# Patient Record
Sex: Male | Born: 1990 | Race: White | Hispanic: No | Marital: Single | Smoking: Current every day smoker
Health system: Southern US, Community
[De-identification: ages and names within clinical notes are randomized; demographics above are authoritative.]

## PROBLEM LIST (undated history)

## (undated) DIAGNOSIS — N200 Calculus of kidney: Secondary | ICD-10-CM

## (undated) HISTORY — PX: ADENOIDECTOMY: SUR15

## (undated) HISTORY — PX: TONSILLECTOMY: SUR1361

## (undated) HISTORY — PX: OTHER SURGICAL HISTORY: SHX169

## (undated) HISTORY — PX: TYMPANOPLASTY: SHX33

---
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
## 2012-07-01 ENCOUNTER — Encounter (HOSPITAL_COMMUNITY): Payer: Self-pay

## 2012-07-01 ENCOUNTER — Emergency Department (HOSPITAL_COMMUNITY)
Admission: EM | Admit: 2012-07-01 | Discharge: 2012-07-01 | Disposition: A | Discharge status: Home / Self Care | Payer: Self-pay | Attending: Emergency Medicine | Admitting: Emergency Medicine

## 2012-07-01 DIAGNOSIS — T485X1A Poisoning by other anti-common-cold drugs, accidental (unintentional), initial encounter: Secondary | ICD-10-CM | POA: Insufficient documentation

## 2012-07-01 DIAGNOSIS — Y92009 Unspecified place in unspecified non-institutional (private) residence as the place of occurrence of the external cause: Secondary | ICD-10-CM | POA: Insufficient documentation

## 2012-07-01 DIAGNOSIS — Y939 Activity, unspecified: Secondary | ICD-10-CM | POA: Insufficient documentation

## 2012-07-01 DIAGNOSIS — R45851 Suicidal ideations: Secondary | ICD-10-CM | POA: Insufficient documentation

## 2012-07-01 DIAGNOSIS — F3289 Other specified depressive episodes: Secondary | ICD-10-CM | POA: Insufficient documentation

## 2012-07-01 DIAGNOSIS — T50901A Poisoning by unspecified drugs, medicaments and biological substances, accidental (unintentional), initial encounter: Secondary | ICD-10-CM

## 2012-07-01 DIAGNOSIS — F17200 Nicotine dependence, unspecified, uncomplicated: Secondary | ICD-10-CM | POA: Insufficient documentation

## 2012-07-01 DIAGNOSIS — T50992A Poisoning by other drugs, medicaments and biological substances, intentional self-harm, initial encounter: Secondary | ICD-10-CM | POA: Insufficient documentation

## 2012-07-01 DIAGNOSIS — F329 Major depressive disorder, single episode, unspecified: Secondary | ICD-10-CM

## 2012-07-01 LAB — COMPREHENSIVE METABOLIC PANEL
ALT: 16 U/L (ref 0–53)
Albumin: 4.7 g/dL (ref 3.5–5.2)
Alkaline Phosphatase: 83 U/L (ref 39–117)
BUN: 9 mg/dL (ref 6–23)
CO2: 26 mEq/L (ref 19–32)
Chloride: 102 mEq/L (ref 96–112)
GFR calc non Af Amer: >90 mL/min (ref >90)
Total Protein: 7.8 g/dL (ref 6.0–8.3)

## 2012-07-01 LAB — CBC WITH DIFFERENTIAL/PLATELET
Basophils Absolute: 0 10*3/uL (ref 0.0–0.1)
Basophils Relative: 0 % (ref 0–1)
Eosinophils Absolute: 0 10*3/uL (ref 0.0–0.7)
Lymphocytes Relative: 15 % (ref 12–46)
MCV: 87.1 fL (ref 78.0–100.0)
Neutro Abs: 8.2 10*3/uL — ABNORMAL HIGH (ref 1.7–7.7)
RBC: 4.82 MIL/uL (ref 4.22–5.81)
WBC: 10.4 10*3/uL (ref 4.0–10.5)

## 2012-07-01 LAB — ACETAMINOPHEN LEVEL: Acetaminophen (Tylenol), Serum: <15 ug/mL (ref 10–30)

## 2012-07-01 MED ORDER — ALUM & MAG HYDROXIDE-SIMETH 200-200-20 MG/5ML PO SUSP: 30.0000 mL | ORAL | Status: DC | PRN

## 2012-07-01 MED ORDER — ZOLPIDEM TARTRATE 5 MG PO TABS: 10.0000 mg | ORAL_TABLET | Freq: Every evening | ORAL | Status: DC | PRN

## 2012-07-01 MED ORDER — ACETAMINOPHEN 325 MG PO TABS: 650.0000 mg | ORAL_TABLET | ORAL | Status: DC | PRN

## 2012-07-01 MED ORDER — IBUPROFEN 400 MG PO TABS: 600.0000 mg | ORAL_TABLET | Freq: Three times a day (TID) | ORAL | Status: DC | PRN

## 2012-07-01 MED ORDER — NICOTINE 21 MG/24HR TD PT24
21.0000 mg | MEDICATED_PATCH | Freq: Every day | TRANSDERMAL | Status: DC
  Administered 2012-07-01: 21 mg via TRANSDERMAL
  Filled 2012-07-01: qty 1

## 2012-07-01 MED ORDER — SODIUM CHLORIDE 0.9 % IV SOLN: INTRAVENOUS | Status: DC

## 2012-07-01 MED ORDER — ONDANSETRON HCL 4 MG PO TABS: 4.0000 mg | ORAL_TABLET | Freq: Three times a day (TID) | ORAL | Status: DC | PRN

## 2012-07-01 MED ORDER — CITALOPRAM HYDROBROMIDE 20 MG PO TABS: 20.0000 mg | ORAL_TABLET | Freq: Every day | ORAL | Status: DC

## 2012-07-01 NOTE — ED Notes (Signed)
Per ems, pt reports taking 42 OTC decongestant tablets at 0230.  Pt reports that he is going through a hard time and wanted to hurt himself last night but doesn't want to hurt himself today.  Pt reports that he is having trouble at home with his ex-wife and baby.

## 2012-07-01 NOTE — ED Provider Notes (Signed)
History   This chart was scribed for Ward Givens, MD by Gerlean Ren. This patient was seen in room APA06/[facility]06 and the patient's care was started at 14:15.   CSN: [id number]  Arrival date & time 07/01/12  1335   First MD Initiated Contact with Patient 07/01/12 1352      Chief Complaint  Patient presents with  . Ingestion   Level V caveat for psychiatric problem  (Consider location/radiation/quality/duration/timing/severity/associated sxs/prior treatment) The history is provided by the patient and a parent. No language interpreter was used.   Jason Khan is a 21 y.o. male brought in by ambulance to the Emergency Department after taking # 32 tablets of chlorpheniramine  4mg /dextromethorphan 30mg   to attempt suicide.  He states he bought the packs of pills a couple days ago and was planning to get high. Pt moved back home from [place] 1 week ago after having separated from wife  4-5 months ago, who still has his 2 y.o. daughter and has been depressed since then.  Per mother, pt was behaving abnormally this morning at 08:00 when he got up "talking about God a lot" and relates he had a religious experience and being unusually emotional.  She reports he was groggy and falling and "stop breathing" a few times. Pt did not tell mother of what he had done until 10:00.  Pt denies being in any current pain and does not answer when asked if he is currently suicidal.  Patient initially stated he took the pills to "go to sleep" but he then admits he was trying to hurt himself.  Pt has no h/o chronic medical conditions.  Pt reports tobacco use but denies alcohol use.    PCP none  History reviewed. No pertinent past medical history.  History reviewed. No pertinent past surgical history.  No family history on file.  History  Substance Use Topics  . Smoking status: Yes 1/2 ppd  . Smokeless tobacco: Not on file  . Alcohol Use: No  +[employer] Unemployed Living with mother Separated from  wife   Review of Systems  Neurological: Negative for dizziness.  Psychiatric/Behavioral: Positive for suicidal ideas.  All other systems reviewed and are negative.    Allergies  Review of patient's allergies indicates no known allergies.  Home Medications  No current outpatient prescriptions on file.  BP 147/100  Pulse 89  Temp 98.3 F (36.8 C) (Oral)  Resp 14  Ht 6' (1.829 m)  Wt 130 lb (58.968 kg)  BMI 17.63 kg/m2  SpO2 96%  Vital signs normal except hypertension   Physical Exam  Nursing note and vitals reviewed. Constitutional: He is oriented to person, place, and time. He appears well-developed and well-nourished.  Non-toxic appearance. He does not appear ill. No distress.  HENT:  Head: Normocephalic and atraumatic.  Right Ear: External ear normal.  Left Ear: External ear normal.  Nose: Nose normal. No mucosal edema or rhinorrhea.  Mouth/Throat: Oropharynx is clear and moist and mucous membranes are normal. No dental abscesses or uvula swelling.       Tongue is dry.  Eyes: Conjunctivae normal and EOM are normal. Pupils are equal, round, and reactive to light.  Neck: Normal range of motion and full passive range of motion without pain. Neck supple.  Cardiovascular: Normal rate, regular rhythm and normal heart sounds.  Exam reveals no gallop and no friction rub.   No murmur heard. Pulmonary/Chest: Effort normal and breath sounds normal. No respiratory distress. He has no wheezes. He  has no rhonchi. He has no rales. He exhibits no tenderness and no crepitus.  Abdominal: Soft. Normal appearance and bowel sounds are normal. He exhibits no distension. There is no tenderness. There is no rebound and no guarding.  Musculoskeletal: Normal range of motion. He exhibits no edema and no tenderness.       Moves all extremities well.   Neurological: He is alert and oriented to person, place, and time. He has normal strength. No cranial nerve deficit.  Skin: Skin is warm, dry and  intact. No rash noted. No erythema. No pallor.  Psychiatric: His speech is normal. His mood appears not anxious.       Flat affect.  Spoke quietly. Responds slowly    ED Course  Procedures (including critical care time)     DIAGNOSTIC STUDIES: Oxygen Saturation is 96% on room air, adequate by my interpretation.    COORDINATION OF CARE: 14:28- Patient and family informed of clinical course, understand medical decision-making process, and agree with plan.  Ordered IV fluids, CBC, c-met, ethanol, drug screen, acetaminophen, and salicylate.  15:23 Samson Frederic, ACT aware patient needs evaluation  15:48 Poison Control states anti-cholinergic overdose, once his tachycardia and hypertension has resolved which could be 6-12 hours after ingestion he would be considered medically clear. She also states he was at risk for seizure and if he has a seizure to treat with benzos and mainly supportive care at this point.   16:20 BP 128/83, HR 83  telepsych consult ordered   Results for orders placed during the hospital encounter of 07/01/12  CBC WITH DIFFERENTIAL      Component Value Range   WBC 10.4  4.0 - 10.5 K/uL   RBC 4.82  4.22 - 5.81 MIL/uL   Hemoglobin 14.9  13.0 - 17.0 g/dL   HCT 73.2  20.2 - 54.2 %   MCV 87.1  78.0 - 100.0 fL   MCH 30.9  26.0 - 34.0 pg   MCHC 35.5  30.0 - 36.0 g/dL   RDW 70.6  23.7 - 62.8 %   Platelets 281  150 - 400 K/uL   Neutrophils Relative 79 (*) 43 - 77 %   Neutro Abs 8.2 (*) 1.7 - 7.7 K/uL   Lymphocytes Relative 15  12 - 46 %   Lymphs Abs 1.5  0.7 - 4.0 K/uL   Monocytes Relative 6  3 - 12 %   Monocytes Absolute 0.6  0.1 - 1.0 K/uL   Eosinophils Relative 0  0 - 5 %   Eosinophils Absolute 0.0  0.0 - 0.7 K/uL   Basophils Relative 0  0 - 1 %   Basophils Absolute 0.0  0.0 - 0.1 K/uL  COMPREHENSIVE METABOLIC PANEL      Component Value Range   Sodium 139  135 - 145 mEq/L   Potassium 3.8  3.5 - 5.1 mEq/L   Chloride 102  96 - 112 mEq/L   CO2 26  19 - 32 mEq/L    Glucose, Bld 92  70 - 99 mg/dL   BUN 9  6 - 23 mg/dL   Creatinine, Ser 3.15  0.50 - 1.35 mg/dL   Calcium 9.6  8.4 - 17.6 mg/dL   Total Protein 7.8  6.0 - 8.3 g/dL   Albumin 4.7  3.5 - 5.2 g/dL   AST 16  0 - 37 U/L   ALT 16  0 - 53 U/L   Alkaline Phosphatase 83  39 - 117 U/L   Total Bilirubin  0.4  0.3 - 1.2 mg/dL   GFR calc non Af Amer >90  >90 mL/min   GFR calc Af Amer >90  >90 mL/min  ETHANOL      Component Value Range   Alcohol, Ethyl (B) <11  0 - 11 mg/dL  ACETAMINOPHEN LEVEL      Component Value Range   Acetaminophen (Tylenol), Serum <15.0  10 - 30 ug/mL  SALICYLATE LEVEL      Component Value Range   Salicylate Lvl <2.0 (*) 2.8 - 20.0 mg/dL   Laboratory interpretation all normal except       1. Depression   2. Overdose     Plan psychiatric placement.  MDM   I personally performed the services described in this documentation, which was scribed in my presence. The recorded information has been reviewed and considered.  Devoria Albe, MD, Armando Gang         Ward Givens, MD 07/01/12 [phone]

## 2012-07-01 NOTE — BH Assessment (Signed)
Assessment Note   Jason Khan is an 21 y.o. male.PT REPORTS HE TOOK 32 OVER THE COUNTER COLD PILLS LAST NIGHT JUST TO HAVE FUN WITH THEM THEN HE REPORTS HE DID NOT CARE IF HE WOKE UP OR NOT.  PT THEN REPORTS HE'S NOT SUICIDAL.  HE TOLD HIS MOTHER WHAT HE HAD DONE . HE WAS THEN BROUGHT TO THE ED.  PT REPORTS HE AND HIS WIFE SEPARATED 7-8 MONTHS AGO AND SHE HAS THEIR 2 YR OLD DAUGHTER.  HE REPORTS HE SAW THEIR DAUGHTER DAILY UNTIL MOVING BACK TO  [facility]  RECENTLY.  PT DENIES DRUG USE OR EVER DOING ANYTHING LIKE THIS BEFORE.  DR Philhaven REQUESTED TELE-PSYCH.        Axis I: Mood Disorder NOS Axis II: Deferred Axis III: History reviewed. No pertinent past medical history. Axis IV: problems with primary support group Axis V: 51-60 moderate symptoms         Past Medical History: History reviewed. No pertinent past medical history.  History reviewed. No pertinent past surgical history.  Family History: No family history on file.  Social History:  does not have a smoking history on file. He does not have any smokeless tobacco history on file. His alcohol and drug histories not on file.  Additional Social History:  Alcohol / Drug Use Pain Medications: na Prescriptions: na Over the Counter: na History of alcohol / drug use?: No history of alcohol / drug abuse  CIWA: CIWA-Ar BP: 138/91 mmHg Pulse Rate: 77  COWS:    Allergies: No Known Allergies  Home Medications:  (Not in a hospital admission)  OB/GYN Status:  No LMP for male patient.  General Assessment Data Location of Assessment: [facility] ACT Assessment: Yes Living Arrangements: Parent Can pt return to current living arrangement?: Yes Admission Status: Voluntary Is patient capable of signing voluntary admission?: Yes Transfer from: Acute Hospital Referral Source: MD     Risk to self Suicidal Ideation: No Is patient at risk for suicide?: No Suicidal Plan?: No Access to Means: No What has been your  use of drugs/alcohol within the last 12 months?: denies Previous Attempts/Gestures: No How many times?: 0  Other Self Harm Risks: na Triggers for Past Attempts: None known Intentional Self Injurious Behavior: None Family Suicide History: No Recent stressful life event(s): Loss (Comment) (seperation from wife 7-8 months ago) Persecutory voices/beliefs?: No Depression: Yes Depression Symptoms: Despondent;Feeling worthless/self pity Substance abuse history and/or treatment for substance abuse?: No Suicide prevention information given to non-admitted patients: Not applicable  Risk to Others Homicidal Ideation: No Thoughts of Harm to Others: No Current Homicidal Intent: No Current Homicidal Plan: No Access to Homicidal Means: No History of harm to others?: No Assessment of Violence: None Noted Violent Behavior Description: na Does patient have access to weapons?: No Criminal Charges Pending?: No Does patient have a court date: No  Psychosis Hallucinations: None noted Delusions: None noted  Mental Status Report Appear/Hygiene: Improved Eye Contact: Good Motor Activity: Freedom of movement Speech: Logical/coherent Level of Consciousness: Alert Mood: Depressed Affect: Depressed;Appropriate to circumstance Anxiety Level: Minimal Thought Processes: Coherent;Relevant Judgement: Impaired Orientation: Person;Place;Time;Situation Obsessive Compulsive Thoughts/Behaviors: Minimal  Cognitive Functioning Concentration: Normal Memory: Recent Intact;Remote Intact IQ: Average Insight: Fair Impulse Control: Fair Appetite: Good Sleep: No Change Total Hours of Sleep: 8  Vegetative Symptoms: None  ADLScreening [facility] Assessment Services) Patient's cognitive ability adequate to safely complete daily activities?: Yes Patient able to express need for assistance with ADLs?: Yes Independently performs ADLs?: Yes (  appropriate for developmental age)  Abuse/Neglect [facility]) Physical Abuse:  Denies Verbal Abuse: Denies Sexual Abuse: Denies  Prior Inpatient Therapy Prior Inpatient Therapy: No  Prior Outpatient Therapy Prior Outpatient Therapy: No  ADL Screening (condition at time of admission) Patient's cognitive ability adequate to safely complete daily activities?: Yes Patient able to express need for assistance with ADLs?: Yes Independently performs ADLs?: Yes (appropriate for developmental age) Weakness of Legs: None Weakness of Arms/Hands: None  Home Assistive Devices/Equipment Home Assistive Devices/Equipment: None  Therapy Consults (therapy consults require a physician order) PT Evaluation Needed: No OT Evalulation Needed: No SLP Evaluation Needed: No Abuse/Neglect Assessment (Assessment to be complete while patient is alone) Physical Abuse: Denies Verbal Abuse: Denies Sexual Abuse: Denies Exploitation of patient/patient's resources: Denies Self-Neglect: Denies Values / Beliefs Cultural Requests During Hospitalization: None Spiritual Requests During Hospitalization: None Consults Spiritual Care Consult Needed: No Social Work Consult Needed: No Merchant navy officer (For Healthcare) Advance Directive: Patient does not have advance directive;Patient would not like information Pre-existing out of facility DNR order (yellow form or pink MOST form): No    Additional Information 1:1 In Past 12 Months?: No CIRT Risk: No Elopement Risk: No Does patient have medical clearance?: Yes     Disposition: pending tele-psych WITH COMMITMENT PAPERS UNTIL DISPOSITION Disposition Disposition of Patient:  (pending tele-psych)  On Site Evaluation by: DR Samuel Jester  Reviewed with Physician: DR Samuel Jester    Jason Khan 07/01/2012 9:34 PM

## 2012-07-01 NOTE — ED Provider Notes (Signed)
Pt seen by telepsych and is no longer suicidal telepsych feels patient can be discharged on celexa Pt denies SI at this time.  Seems reasonable to d/c home and close outpatient followup He has also been seen by ACT Labs/vitals reviewed BP 138/91  Pulse 77  Temp 98.3 F (36.8 C) (Oral)  Resp 20  Ht 6' (1.829 m)  Wt 130 lb (58.968 kg)  BMI 17.63 kg/m2  SpO2 97%    Joya Gaskins, MD 07/01/12 1848

## 2012-07-01 NOTE — ED Notes (Signed)
Patient stating he wants to go home and doesn't really want to go to treatment.  Counseled him re: his discussion w/ACT representative.  Reminded him that if he was not voluntary, he would have involuntary committal papers taken out.  He requests IV out and monitor off, of which he was obliged.  Requested he put on paper scrubs.

## 2012-07-01 NOTE — ED Notes (Signed)
Patient with no complaints at this time. Respirations even and unlabored. Skin warm/dry. Discharge instructions reviewed with patient at this time. Patient given opportunity to voice concerns/ask questions. Patient discharged at this time and left Emergency Department with steady gait.   

## 2012-07-01 NOTE — ED Notes (Signed)
Spoke w/Tonya at [facility] to update.

## 2012-12-19 ENCOUNTER — Emergency Department (HOSPITAL_COMMUNITY): Payer: Self-pay

## 2012-12-19 ENCOUNTER — Encounter (HOSPITAL_COMMUNITY): Payer: Self-pay | Admitting: Emergency Medicine

## 2012-12-19 ENCOUNTER — Emergency Department (HOSPITAL_COMMUNITY)
Admission: EM | Admit: 2012-12-19 | Discharge: 2012-12-20 | Disposition: A | Discharge status: Home / Self Care | Payer: Self-pay | Attending: Emergency Medicine | Admitting: Emergency Medicine

## 2012-12-19 DIAGNOSIS — F17200 Nicotine dependence, unspecified, uncomplicated: Secondary | ICD-10-CM | POA: Insufficient documentation

## 2012-12-19 DIAGNOSIS — Z79899 Other long term (current) drug therapy: Secondary | ICD-10-CM | POA: Insufficient documentation

## 2012-12-19 DIAGNOSIS — R109 Unspecified abdominal pain: Secondary | ICD-10-CM | POA: Insufficient documentation

## 2012-12-19 DIAGNOSIS — Z87442 Personal history of urinary calculi: Secondary | ICD-10-CM | POA: Insufficient documentation

## 2012-12-19 HISTORY — DX: Calculus of kidney: N20.0

## 2012-12-19 LAB — URINALYSIS, ROUTINE W REFLEX MICROSCOPIC
Bilirubin Urine: NEGATIVE
Glucose, UA: NEGATIVE mg/dL
Hgb urine dipstick: NEGATIVE
Protein, ur: NEGATIVE mg/dL
pH: 7 (ref 5.0–8.0)

## 2012-12-19 MED ORDER — ONDANSETRON HCL 4 MG/2ML IJ SOLN
4.0000 mg | Freq: Once | INTRAMUSCULAR | Status: AC
  Administered 2012-12-19: 4 mg via INTRAVENOUS
  Filled 2012-12-19: qty 2

## 2012-12-19 MED ORDER — HYDROMORPHONE HCL PF 1 MG/ML IJ SOLN
1.0000 mg | Freq: Once | INTRAMUSCULAR | Status: AC
  Administered 2012-12-19: 1 mg via INTRAVENOUS
  Filled 2012-12-19: qty 1

## 2012-12-19 NOTE — ED Notes (Signed)
Patient complaining of left flank pain radiating into left groin area x 4 days. Patient has history of kidney stones.

## 2012-12-20 ENCOUNTER — Telehealth (HOSPITAL_COMMUNITY): Payer: Self-pay | Admitting: Emergency Medicine

## 2012-12-20 MED ORDER — HYDROMORPHONE HCL PF 1 MG/ML IJ SOLN
1.0000 mg | Freq: Once | INTRAMUSCULAR | Status: AC
  Administered 2012-12-20: 1 mg via INTRAVENOUS
  Filled 2012-12-20: qty 1

## 2012-12-20 MED ORDER — KETOROLAC TROMETHAMINE 30 MG/ML IJ SOLN
30.0000 mg | Freq: Once | INTRAMUSCULAR | Status: AC
  Administered 2012-12-20: 30 mg via INTRAVENOUS
  Filled 2012-12-20: qty 1

## 2012-12-20 MED ORDER — CYCLOBENZAPRINE HCL 10 MG PO TABS: 10.0000 mg | ORAL_TABLET | Freq: Three times a day (TID) | ORAL | Status: DC | PRN

## 2012-12-20 MED ORDER — MELOXICAM 7.5 MG PO TABS: 7.5000 mg | ORAL_TABLET | Freq: Every day | ORAL | Status: DC

## 2012-12-20 MED ORDER — HYDROCODONE-ACETAMINOPHEN 7.5-325 MG PO TABS: 1.0000 | ORAL_TABLET | ORAL | Status: DC | PRN

## 2012-12-20 NOTE — ED Provider Notes (Signed)
History     CSN: [id number]  Arrival date & time 12/19/12  2035   First MD Initiated Contact with Patient 12/19/12 2300      Chief Complaint  Patient presents with  . Flank Pain    (Consider location/radiation/quality/duration/timing/severity/associated sxs/prior treatment) Patient is a 22 y.o. male presenting with flank pain. The history is provided by the patient and the spouse.  Flank Pain This is a recurrent problem. The current episode started in the past 7 days. The problem occurs daily. The problem has been gradually worsening. Pertinent negatives include no abdominal pain, arthralgias, chest pain, coughing or neck pain. Associated symptoms comments: Left flank pain. Nothing aggravates the symptoms. He has tried lying down (muscle relaxers) for the symptoms. The treatment provided no relief.    Past Medical History  Diagnosis Date  . Kidney stones     Past Surgical History  Procedure Laterality Date  . Tonsillectomy    . Adenoidectomy    . Tubes in ears      History reviewed. No pertinent family history.  History  Substance Use Topics  . Smoking status: Current Every Day Smoker  . Smokeless tobacco: Not on file  . Alcohol Use: No      Review of Systems  Constitutional: Negative for activity change.       All ROS Neg except as noted in HPI  HENT: Negative for nosebleeds and neck pain.   Eyes: Negative for photophobia and discharge.  Respiratory: Negative for cough, shortness of breath and wheezing.   Cardiovascular: Negative for chest pain and palpitations.  Gastrointestinal: Negative for abdominal pain and blood in stool.  Genitourinary: Positive for flank pain. Negative for dysuria, frequency and hematuria.  Musculoskeletal: Negative for back pain and arthralgias.  Skin: Negative.   Neurological: Negative for dizziness, seizures and speech difficulty.  Psychiatric/Behavioral: Negative for hallucinations and confusion.    Allergies  Review of  patient's allergies indicates no known allergies.  Home Medications   Current Outpatient Rx  Name  Route  Sig  Dispense  Refill  . cyclobenzaprine (FLEXERIL) 10 MG tablet   Oral   Take 10 mg by mouth once as needed for muscle spasms.         Marland Kitchen ibuprofen (ADVIL,MOTRIN) 200 MG tablet   Oral   Take 600-800 mg by mouth daily as needed for pain.         . Prenatal Vit-Fe Fumarate-FA (PRENATAL MULTIVITAMIN) TABS   Oral   Take 1 tablet by mouth daily at 12 noon.           BP 149/89  Pulse 76  Temp(Src) 98.1 F (36.7 C) (Oral)  Resp 24  Ht 6' (1.829 m)  Wt 165 lb (74.844 kg)  BMI 22.37 kg/m2  SpO2 100%  Physical Exam  Nursing note and vitals reviewed. Constitutional: He is oriented to person, place, and time. He appears well-developed and well-nourished.  Non-toxic appearance.  HENT:  Head: Normocephalic.  Right Ear: Tympanic membrane and external ear normal.  Left Ear: Tympanic membrane and external ear normal.  Eyes: EOM and lids are normal. Pupils are equal, round, and reactive to light.  Neck: Normal range of motion. Neck supple. Carotid bruit is not present.  Cardiovascular: Normal rate, regular rhythm, normal heart sounds, intact distal pulses and normal pulses.   Pulmonary/Chest: Breath sounds normal. No respiratory distress.  Course breath sounds and mod rhonchi.  Abdominal: Soft. Bowel sounds are normal. There is no tenderness. There is no guarding.  Left CVAT.   Genitourinary: Penis normal.  No testicular swelling. No evidence for hernia.  Musculoskeletal: Normal range of motion.  Left flank pain with attempted ROM of the lower back.  Lymphadenopathy:       Head (right side): No submandibular adenopathy present.       Head (left side): No submandibular adenopathy present.    He has no cervical adenopathy.  Neurological: He is alert and oriented to person, place, and time. He has normal strength. No cranial nerve deficit or sensory deficit.  Skin: Skin is  warm and dry.  Psychiatric: He has a normal mood and affect. His speech is normal.    ED Course  Procedures (including critical care time)  Labs Reviewed  URINALYSIS, ROUTINE W REFLEX MICROSCOPIC   Ct Abdomen Pelvis Wo Contrast  12/20/2012  *RADIOLOGY REPORT*  Clinical Data: Flank and testicle pain.  CT ABDOMEN AND PELVIS WITHOUT CONTRAST  Technique:  Multidetector CT imaging of the abdomen and pelvis was performed following the standard protocol without intravenous contrast.  Comparison: 07/26/2006  Findings: Limited images through the lung bases demonstrate no significant appreciable abnormality. The heart size is within normal limits. No pleural or pericardial effusion.  Organ abnormality/lesion detection is limited in the absence of intravenous contrast. Within this limitation, unremarkable liver, biliary system, spleen, pancreas, adrenal glands.  Nonobstructing right renal stone, upper pole.  Minimal hydronephrosis to the level of a 4 mm stone at or just below the UPJ.  No hydronephrosis or hydroureter on the left.  No CT evidence for colitis.  Appendix normal.  Small bowel loops are normal course and caliber.  No free intraperitoneal air or fluid.  No lymphadenopathy.  Normal caliber aorta and branch vessels.  Thin-walled bladder.  Nonspecific central prostatic calcifications.  No acute osseous finding.  IMPRESSION: Mild right hydronephrosis to the level of a 4 mm stone at the UPJ.  Nonobstructing upper pole right renal stone.   Original Report Authenticated By: Jearld Lesch, M.D.    Dg Chest 2 View  12/20/2012  *RADIOLOGY REPORT*  Clinical Data: Pain with inspiration.  Smoker.  CHEST - 2 VIEW  Comparison: None.  Findings: Midline trachea.  Normal heart size and mediastinal contours. No pleural effusion or pneumothorax.  Clear lungs.  IMPRESSION: No acute cardiopulmonary disease.   Original Report Authenticated By: Jeronimo Greaves, M.D.      No diagnosis found.    MDM  I have reviewed  nursing notes, vital signs, and all appropriate lab and imaging results for this patient.  Pt ;presents to ED with flank pain on the left flank. UA wnl. CT reveals a 4mm stone at the UPJ on the right.  Chest xray is negative. Plan -  Urology evaluation as out patient. Rx for flexeril (patieint request) , norco, and mobic given to the patient. Pt to strain all urine. He will return if any changes or problem.       Kathie Dike, PA-C 12/29/12 [phone]

## 2012-12-20 NOTE — ED Notes (Signed)
Pt just wanting to get Rx for Vicodin filled.  Is that ok.?  Spoke w/ pharmacy ok to fill just vicodin Rx.

## 2012-12-30 NOTE — ED Provider Notes (Signed)
Medical screening examination/treatment/procedure(s) were performed by non-physician practitioner and as supervising physician I was immediately available for consultation/collaboration.  John-Adam Ladonne Sharples, M.D.  John-Adam Zaveon Gillen, MD 12/30/12 0825 

## 2013-01-05 ENCOUNTER — Emergency Department (HOSPITAL_COMMUNITY): Payer: Self-pay

## 2013-01-05 ENCOUNTER — Encounter (HOSPITAL_COMMUNITY): Payer: Self-pay | Admitting: *Deleted

## 2013-01-05 ENCOUNTER — Emergency Department (HOSPITAL_COMMUNITY)
Admission: EM | Admit: 2013-01-05 | Discharge: 2013-01-05 | Disposition: A | Discharge status: Home / Self Care | Payer: Self-pay | Attending: Emergency Medicine | Admitting: Emergency Medicine

## 2013-01-05 DIAGNOSIS — N23 Unspecified renal colic: Secondary | ICD-10-CM | POA: Insufficient documentation

## 2013-01-05 DIAGNOSIS — Z79899 Other long term (current) drug therapy: Secondary | ICD-10-CM | POA: Insufficient documentation

## 2013-01-05 DIAGNOSIS — Z87442 Personal history of urinary calculi: Secondary | ICD-10-CM | POA: Insufficient documentation

## 2013-01-05 DIAGNOSIS — R110 Nausea: Secondary | ICD-10-CM | POA: Insufficient documentation

## 2013-01-05 DIAGNOSIS — F17200 Nicotine dependence, unspecified, uncomplicated: Secondary | ICD-10-CM | POA: Insufficient documentation

## 2013-01-05 LAB — COMPREHENSIVE METABOLIC PANEL
ALT: 11 U/L (ref 0–53)
AST: 14 U/L (ref 0–37)
Albumin: 3.9 g/dL (ref 3.5–5.2)
Alkaline Phosphatase: 105 U/L (ref 39–117)
Chloride: 101 mEq/L (ref 96–112)
Potassium: 3.7 mEq/L (ref 3.5–5.1)
Total Bilirubin: 0.1 mg/dL — ABNORMAL LOW (ref 0.3–1.2)

## 2013-01-05 LAB — URINALYSIS, ROUTINE W REFLEX MICROSCOPIC
Ketones, ur: NEGATIVE mg/dL
Leukocytes, UA: NEGATIVE
Nitrite: NEGATIVE
Protein, ur: NEGATIVE mg/dL
pH: 8 (ref 5.0–8.0)

## 2013-01-05 LAB — CBC WITH DIFFERENTIAL/PLATELET
Basophils Absolute: 0.1 10*3/uL (ref 0.0–0.1)
Basophils Relative: 1 % (ref 0–1)
Hemoglobin: 13.9 g/dL (ref 13.0–17.0)
MCHC: 35.1 g/dL (ref 30.0–36.0)
Neutro Abs: 2.9 10*3/uL (ref 1.7–7.7)
Neutrophils Relative %: 44 % (ref 43–77)
RDW: 13.4 % (ref 11.5–15.5)
WBC: 6.5 10*3/uL (ref 4.0–10.5)

## 2013-01-05 MED ORDER — SODIUM CHLORIDE 0.9 % IV SOLN
1000.0000 mL | Freq: Once | INTRAVENOUS | Status: AC
  Administered 2013-01-05: 1000 mL via INTRAVENOUS

## 2013-01-05 MED ORDER — HYDROMORPHONE HCL PF 1 MG/ML IJ SOLN
1.0000 mg | Freq: Once | INTRAMUSCULAR | Status: AC
  Administered 2013-01-05: 1 mg via INTRAVENOUS
  Filled 2013-01-05: qty 1

## 2013-01-05 MED ORDER — OXYCODONE-ACETAMINOPHEN 5-325 MG PO TABS: 1.0000 | ORAL_TABLET | ORAL | Status: DC | PRN

## 2013-01-05 MED ORDER — ONDANSETRON HCL 4 MG/2ML IJ SOLN
4.0000 mg | Freq: Once | INTRAMUSCULAR | Status: AC
  Administered 2013-01-05: 4 mg via INTRAVENOUS
  Filled 2013-01-05: qty 2

## 2013-01-05 MED ORDER — SODIUM CHLORIDE 0.9 % IV SOLN
1000.0000 mL | INTRAVENOUS | Status: DC
  Administered 2013-01-05: 1000 mL via INTRAVENOUS

## 2013-01-05 MED ORDER — KETOROLAC TROMETHAMINE 30 MG/ML IJ SOLN
30.0000 mg | Freq: Once | INTRAMUSCULAR | Status: AC
  Administered 2013-01-05: 30 mg via INTRAVENOUS
  Filled 2013-01-05: qty 1

## 2013-01-05 NOTE — ED Provider Notes (Signed)
History     CSN: [id number]  Arrival date & time 01/05/13  1520   First MD Initiated Contact with Patient 01/05/13 1615      Chief Complaint  Patient presents with  . Flank Pain    (Consider location/radiation/quality/duration/timing/severity/associated sxs/prior treatment) Patient is a 22 y.o. male presenting with flank pain. The history is provided by the patient.  Flank Pain  He had been in the emergency department several weeks ago with left-sided kidney stone. 4 days ago, he started having pain in the right flank which is similar to the pain he had with the kidney stone on the other side. There is associated nausea without vomiting. He noticed a decreased urine output but no dysuria. Nothing makes his pain better nothing makes it worse. Pain is moderate to severe and he rates it at 8/10. He has been taking BC powder for pain with only slight relief. He denies fever, chills, sweats.  Past Medical History  Diagnosis Date  . Kidney stones     Past Surgical History  Procedure Laterality Date  . Tonsillectomy    . Adenoidectomy    . Tubes in ears      No family history on file.  History  Substance Use Topics  . Smoking status: Current Every Day Smoker    Types: Cigarettes  . Smokeless tobacco: Not on file  . Alcohol Use: No      Review of Systems  Genitourinary: Positive for flank pain.  All other systems reviewed and are negative.    Allergies  Review of patient's allergies indicates no known allergies.  Home Medications   Current Outpatient Rx  Name  Route  Sig  Dispense  Refill  . Chlorpheniramine-DM (CORICIDIN HBP COUGH/COLD PO)   Oral   Take 2 tablets by mouth daily as needed (for cough).         . cyclobenzaprine (FLEXERIL) 10 MG tablet   Oral   Take 1 tablet (10 mg total) by mouth 3 (three) times daily as needed.   21 tablet   0   . HYDROcodone-acetaminophen (NORCO) 7.5-325 MG per tablet   Oral   Take 1 tablet by mouth every 4 (four) hours  as needed for pain.   20 tablet   0   . ibuprofen (ADVIL,MOTRIN) 200 MG tablet   Oral   Take 600-800 mg by mouth daily as needed for pain.         . Prenatal Vit-Fe Fumarate-FA (PRENATAL MULTIVITAMIN) TABS   Oral   Take 1 tablet by mouth daily at 12 noon.         Marland Kitchen oxyCODONE-acetaminophen (PERCOCET/ROXICET) 5-325 MG per tablet   Oral   Take 1 tablet by mouth every 4 (four) hours as needed for pain.   20 tablet   0     BP 135/79  Pulse 68  Temp(Src) 98.3 F (36.8 C) (Oral)  Resp 18  Ht 6' (1.829 m)  Wt 165 lb (74.844 kg)  BMI 22.37 kg/m2  SpO2 98%  Physical Exam  Nursing note and vitals reviewed.  22 year old male, resting comfortably and in no acute distress. Vital signs are normal. Oxygen saturation is 98%, which is normal. Head is normocephalic and atraumatic. PERRLA, EOMI. Oropharynx is clear. Neck is nontender and supple without adenopathy or JVD. Back is nontender in the midline, and there is moderate right CVA tenderness. Lungs are clear without rales, wheezes, or rhonchi. Chest is nontender. Heart has regular rate and rhythm without  murmur. Abdomen is soft, flat, nontender without masses or hepatosplenomegaly and peristalsis is normoactive. Extremities have no cyanosis or edema, full range of motion is present. Skin is warm and dry without rash. Neurologic: Mental status is normal, cranial nerves are intact, there are no motor or sensory deficits.  ED Course  Procedures (including critical care time)  Results for orders placed during the hospital encounter of 01/05/13  URINALYSIS, ROUTINE W REFLEX MICROSCOPIC      Result Value Range   Color, Urine YELLOW  YELLOW   APPearance CLEAR  CLEAR   Specific Gravity, Urine 1.015  1.005 - 1.030   pH 8.0  5.0 - 8.0   Glucose, UA NEGATIVE  NEGATIVE mg/dL   Hgb urine dipstick NEGATIVE  NEGATIVE   Bilirubin Urine NEGATIVE  NEGATIVE   Ketones, ur NEGATIVE  NEGATIVE mg/dL   Protein, ur NEGATIVE  NEGATIVE mg/dL    Urobilinogen, UA 0.2  0.0 - 1.0 mg/dL   Nitrite NEGATIVE  NEGATIVE   Leukocytes, UA NEGATIVE  NEGATIVE  CBC WITH DIFFERENTIAL      Result Value Range   WBC 6.5  4.0 - 10.5 K/uL   RBC 4.43  4.22 - 5.81 MIL/uL   Hemoglobin 13.9  13.0 - 17.0 g/dL   HCT 13.0  86.5 - 78.4 %   MCV 89.4  78.0 - 100.0 fL   MCH 31.4  26.0 - 34.0 pg   MCHC 35.1  30.0 - 36.0 g/dL   RDW 69.6  29.5 - 28.4 %   Platelets 298  150 - 400 K/uL   Neutrophils Relative 44  43 - 77 %   Neutro Abs 2.9  1.7 - 7.7 K/uL   Lymphocytes Relative 39  12 - 46 %   Lymphs Abs 2.6  0.7 - 4.0 K/uL   Monocytes Relative 10  3 - 12 %   Monocytes Absolute 0.6  0.1 - 1.0 K/uL   Eosinophils Relative 5  0 - 5 %   Eosinophils Absolute 0.4  0.0 - 0.7 K/uL   Basophils Relative 1  0 - 1 %   Basophils Absolute 0.1  0.0 - 0.1 K/uL  COMPREHENSIVE METABOLIC PANEL      Result Value Range   Sodium 140  135 - 145 mEq/L   Potassium 3.7  3.5 - 5.1 mEq/L   Chloride 101  96 - 112 mEq/L   CO2 29  19 - 32 mEq/L   Glucose, Bld 87  70 - 99 mg/dL   BUN 6  6 - 23 mg/dL   Creatinine, Ser 1.32  0.50 - 1.35 mg/dL   Calcium 9.3  8.4 - 44.0 mg/dL   Total Protein 6.6  6.0 - 8.3 g/dL   Albumin 3.9  3.5 - 5.2 g/dL   AST 14  0 - 37 U/L   ALT 11  0 - 53 U/L   Alkaline Phosphatase 105  39 - 117 U/L   Total Bilirubin 0.1 (*) 0.3 - 1.2 mg/dL   GFR calc non Af Amer >90  >90 mL/min   GFR calc Af Amer >90  >90 mL/min  LIPASE, BLOOD      Result Value Range   Lipase 51  11 - 59 U/L   US Renal  01/05/2013  *RADIOLOGY REPORT*  Clinical Data: Flank pain.  RENAL/URINARY TRACT ULTRASOUND COMPLETE  Comparison:  CT abdomen and pelvis 12/19/2012.  Findings:  Right Kidney:  No hydronephrosis.  Well-preserved cortex.  Normal size and parenchymal echotexture without focal  abnormalities. The maximal length is 10.9 cm.  A nonobstructing stone at the lower pole of the right kidney is not visualized.  Left Kidney:  No hydronephrosis.  Well-preserved cortex.  Normal size and  parenchymal echotexture without focal abnormalities. The maximal length is 9.1 cm.  Bladder:  Ureteral jets are noted bilaterally.  A 4.4 mm echogenic focus is noted at the right UVJ and may represent a partially obstructing stone.  The bladder is otherwise unremarkable.  IMPRESSION:  1.  Possible distal right ureteral stone.  An echogenic focus is noted near the right UVJ which may be partially obstructing. 2.  No evidence for hydronephrosis or significant nephrolithiasis.   Original Report Authenticated By: Marin Roberts, M.D.       1. Ureteral colic       MDM  Probable right ureteral colic. Old records are reviewed and he did have a left ureteral stone recently and CT showed a proximal right ureteral calculus as well as a calculus in the right kidney. I reviewed the CT scan and the calculi are not easily visible on topogram, so KUB would not be useful to assess the position. You'll be sent for ultrasound. You'll be given IV fluids, IV ketorolac, IV hydromorphone, and IV ondansetron.  He got modest relief of pain but pain recurred. He was given a second dose of hydromorphone with better relief of pain. Ultrasound does show probable distal right ureteral calculus without hydronephrosis. He is sent home with a prescription for Percocet and is referred to on-call urologist.        Dione Booze, MD 01/05/13 [phone]

## 2013-01-05 NOTE — ED Notes (Signed)
Prepack given as override

## 2013-01-05 NOTE — ED Notes (Signed)
Right flank pain x 4 days with nausea.

## 2013-01-09 MED FILL — Oxycodone w/ Acetaminophen Tab 5-325 MG: ORAL | Qty: 6 | Status: AC

## 2013-01-13 ENCOUNTER — Encounter (HOSPITAL_COMMUNITY): Payer: Self-pay | Admitting: Emergency Medicine

## 2013-01-13 DIAGNOSIS — R109 Unspecified abdominal pain: Secondary | ICD-10-CM | POA: Insufficient documentation

## 2013-01-13 DIAGNOSIS — F17200 Nicotine dependence, unspecified, uncomplicated: Secondary | ICD-10-CM | POA: Insufficient documentation

## 2013-01-13 DIAGNOSIS — Z87442 Personal history of urinary calculi: Secondary | ICD-10-CM | POA: Insufficient documentation

## 2013-01-13 DIAGNOSIS — N200 Calculus of kidney: Secondary | ICD-10-CM | POA: Insufficient documentation

## 2013-01-13 LAB — COMPREHENSIVE METABOLIC PANEL
AST: 17 U/L (ref 0–37)
CO2: 25 mEq/L (ref 19–32)
Chloride: 106 mEq/L (ref 96–112)
Creatinine, Ser: 1.05 mg/dL (ref 0.50–1.35)
GFR calc non Af Amer: >90 mL/min (ref >90)
Total Bilirubin: 0.4 mg/dL (ref 0.3–1.2)

## 2013-01-13 LAB — URINALYSIS, ROUTINE W REFLEX MICROSCOPIC
Glucose, UA: NEGATIVE mg/dL
Ketones, ur: 15 mg/dL — AB
pH: 6.5 (ref 5.0–8.0)

## 2013-01-13 LAB — URINE MICROSCOPIC-ADD ON

## 2013-01-13 LAB — CBC WITH DIFFERENTIAL/PLATELET
Basophils Absolute: 0 10*3/uL (ref 0.0–0.1)
Basophils Relative: 1 % (ref 0–1)
Eosinophils Absolute: 0.2 10*3/uL (ref 0.0–0.7)
Lymphs Abs: 2.5 10*3/uL (ref 0.7–4.0)
MCH: 30.8 pg (ref 26.0–34.0)
MCHC: 34.6 g/dL (ref 30.0–36.0)
Neutrophils Relative %: 53 % (ref 43–77)
Platelets: 283 10*3/uL (ref 150–400)
RBC: 4.55 MIL/uL (ref 4.22–5.81)

## 2013-01-13 NOTE — ED Notes (Signed)
PT. REPORTS RECURRING RIGHT FLANK PAIN RADIATING TO RIGHT TESTICLE WITH HEMATURIA ONSET LAST NIGHT UNRELIEVED BY PRESCRIPTION PAIN MEDICATIONS FOR KIDNEY STONES , HOSPITALIZED AT [facility] LAST WEEK FOR KIDNEY STONES.

## 2013-01-14 ENCOUNTER — Emergency Department (HOSPITAL_COMMUNITY)
Admission: EM | Admit: 2013-01-14 | Discharge: 2013-01-14 | Disposition: A | Discharge status: Home / Self Care | Payer: Self-pay | Attending: Emergency Medicine | Admitting: Emergency Medicine

## 2013-01-14 ENCOUNTER — Telehealth (HOSPITAL_COMMUNITY): Payer: Self-pay | Admitting: Emergency Medicine

## 2013-01-14 DIAGNOSIS — R109 Unspecified abdominal pain: Secondary | ICD-10-CM

## 2013-01-14 DIAGNOSIS — N200 Calculus of kidney: Secondary | ICD-10-CM

## 2013-01-14 MED ORDER — ONDANSETRON 4 MG PO TBDP: 4.0000 mg | ORAL_TABLET | Freq: Three times a day (TID) | ORAL | Status: DC | PRN

## 2013-01-14 MED ORDER — SODIUM CHLORIDE 0.9 % IV SOLN
Freq: Once | INTRAVENOUS | Status: AC
  Administered 2013-01-14: 03:00:00 via INTRAVENOUS

## 2013-01-14 MED ORDER — BELLADONNA ALKALOIDS-OPIUM 16.2-60 MG RE SUPP: 1.0000 | Freq: Two times a day (BID) | RECTAL | Status: DC | PRN

## 2013-01-14 MED ORDER — HYDROMORPHONE HCL PF 1 MG/ML IJ SOLN
1.0000 mg | Freq: Once | INTRAMUSCULAR | Status: AC
  Administered 2013-01-14: 1 mg via INTRAVENOUS
  Filled 2013-01-14: qty 1

## 2013-01-14 MED ORDER — KETOROLAC TROMETHAMINE 30 MG/ML IJ SOLN
30.0000 mg | Freq: Once | INTRAMUSCULAR | Status: AC
  Administered 2013-01-14: 30 mg via INTRAVENOUS
  Filled 2013-01-14: qty 1

## 2013-01-14 MED ORDER — BELLADONNA ALKALOIDS-OPIUM 16.2-60 MG RE SUPP
1.0000 | Freq: Three times a day (TID) | RECTAL | Status: DC | PRN
  Administered 2013-01-14: 1 via RECTAL
  Filled 2013-01-14: qty 1

## 2013-01-14 MED ORDER — TAMSULOSIN HCL 0.4 MG PO CAPS: 0.4000 mg | ORAL_CAPSULE | Freq: Every day | ORAL | Status: DC

## 2013-01-14 MED ORDER — OXYCODONE-ACETAMINOPHEN 5-325 MG PO TABS: 2.0000 | ORAL_TABLET | ORAL | Status: DC | PRN

## 2013-01-14 NOTE — ED Notes (Signed)
Family members given refreshments

## 2013-01-14 NOTE — ED Provider Notes (Signed)
History     CSN: [id number]  Arrival date & time 01/13/13  2027   First MD Initiated Contact with Patient 01/14/13 0154      Chief Complaint  Patient presents with  . Flank Pain  . Nephrolithiasis    (Consider location/radiation/quality/duration/timing/severity/associated sxs/prior treatment) HPI 22 year old male presents to emergency room with complaint of right flank pain.  Patient, reports he's had ongoing intermittent, flank pain over the last few weeks.  He was seen at [facility] on April 21 and diagnosed with a kidneys stone by CT scan.  He was seen again on May 5, and diagnosed with another kidney stone.  Patient reports he was a.  No vomiting.  Kathryne Sharper on this past Sunday.  He reports he has run out of the pain and nausea medicines that were given to him.  He has not yet followed up with urology.  He reports a prior history of kidney stones.  He reports he was told he had 4 stones, but has only passed one.  Per records, patient had stone at his UVJ based on ultrasound on his visit at 5/8. 4 mm stones seen on CTscan from 422 at right UPJ.  Records received from [place], showing 3 mm stone in right midurete his past Sunday.  He last had vomiting on Monday. Had fever to 102 5 days ago, none since He denies any allergies. Past Medical History  Diagnosis Date  . Kidney stones     Past Surgical History  Procedure Laterality Date  . Tonsillectomy    . Adenoidectomy    . Tubes in ears      No family history on file.  History  Substance Use Topics  . Smoking status: Current Every Day Smoker    Types: Cigarettes  . Smokeless tobacco: Not on file  . Alcohol Use: No      Review of Systems  All other systems reviewed and are negative.    Allergies  Review of patient's allergies indicates no known allergies.  Home Medications   Current Outpatient Rx  Name  Route  Sig  Dispense  Refill  . ketorolac (TORADOL) 10 MG tablet   Oral   Take 10 mg by mouth every 6  (six) hours as needed for pain.         . promethazine (PHENERGAN) 25 MG tablet   Oral   Take 25 mg by mouth every 6 (six) hours as needed for nausea.         . ondansetron (ZOFRAN ODT) 4 MG disintegrating tablet   Oral   Take 1 tablet (4 mg total) by mouth every 8 (eight) hours as needed for nausea.   20 tablet   0   . opium-belladonna (B&O SUPPRETTES) 16.2-60 MG suppository   Rectal   Place 1 suppository rectally 2 (two) times daily as needed for pain.   20 suppository   0   . oxyCODONE-acetaminophen (PERCOCET/ROXICET) 5-325 MG per tablet   Oral   Take 2 tablets by mouth every 4 (four) hours as needed for pain.   20 tablet   0   . tamsulosin (FLOMAX) 0.4 MG CAPS   Oral   Take 1 capsule (0.4 mg total) by mouth daily.   30 capsule   0     BP 138/74  Pulse 67  Temp(Src) 98.3 F (36.8 C) (Oral)  Resp 16  SpO2 100%  Physical Exam  Nursing note and vitals reviewed. Constitutional: He is oriented to person, place, and  time. He appears well-developed and well-nourished.  patient is lying on the stretcher, in no acute distress, no signs of ureter colic on exam  HENT:  Head: Normocephalic and atraumatic.  Nose: Nose normal.  Mouth/Throat: Oropharynx is clear and moist.  Eyes: Conjunctivae and EOM are normal. Pupils are equal, round, and reactive to light.  Neck: Normal range of motion. Neck supple. No JVD present. No tracheal deviation present. No thyromegaly present.  Cardiovascular: Normal rate, regular rhythm, normal heart sounds and intact distal pulses.  Exam reveals no gallop and no friction rub.   No murmur heard. Pulmonary/Chest: Effort normal and breath sounds normal. No stridor. No respiratory distress. He has no wheezes. He has no rales. He exhibits no tenderness.  Abdominal: Soft. Bowel sounds are normal. He exhibits no distension and no mass. There is no tenderness. There is no rebound and no guarding.  Musculoskeletal: Normal range of motion. He exhibits  no edema and no tenderness.  Lymphadenopathy:    He has no cervical adenopathy.  Neurological: He is alert and oriented to person, place, and time. He exhibits normal muscle tone. Coordination normal.  Skin: Skin is warm and dry. No rash noted. No erythema. No pallor.  Psychiatric: He has a normal mood and affect. His behavior is normal. Judgment and thought content normal.    ED Course  Procedures (including critical care time)  Labs Reviewed  URINALYSIS, ROUTINE W REFLEX MICROSCOPIC - Abnormal; Notable for the following:    Color, Urine AMBER (*)    APPearance CLOUDY (*)    Hgb urine dipstick LARGE (*)    Bilirubin Urine SMALL (*)    Ketones, ur 15 (*)    Protein, ur 30 (*)    Urobilinogen, UA 2.0 (*)    Leukocytes, UA MODERATE (*)    All other components within normal limits  COMPREHENSIVE METABOLIC PANEL - Abnormal; Notable for the following:    Glucose, Bld 69 (*)    All other components within normal limits  URINE CULTURE  CBC WITH DIFFERENTIAL  URINE MICROSCOPIC-ADD ON   No results found.   1. Right flank pain   2. Nephrolithiasis       MDM  22 year old male with nephrolithiasis.  Patient counseled on the importance for followup with urology for definitive care.  Advised he should go to [facility] should he have complications involved with a stone, based on urology preference.  He has received pain and nausea.  Medications here.  Patient sleeping comfortably in the bed with his girlfriend.  He will be discharged home with prescriptions until he is able to be seen by urology.       Olivia Mackie, MD 01/14/13 [phone]

## 2013-01-14 NOTE — ED Notes (Signed)
Dr Otter at bedside  

## 2013-01-15 LAB — URINE CULTURE

## 2013-02-01 ENCOUNTER — Emergency Department (HOSPITAL_COMMUNITY): Payer: Self-pay

## 2013-02-01 ENCOUNTER — Emergency Department (HOSPITAL_COMMUNITY)
Admission: EM | Admit: 2013-02-01 | Discharge: 2013-02-01 | Disposition: A | Discharge status: Home / Self Care | Payer: Self-pay | Attending: Emergency Medicine | Admitting: Emergency Medicine

## 2013-02-01 ENCOUNTER — Encounter (HOSPITAL_COMMUNITY): Payer: Self-pay | Admitting: Emergency Medicine

## 2013-02-01 DIAGNOSIS — R112 Nausea with vomiting, unspecified: Secondary | ICD-10-CM | POA: Insufficient documentation

## 2013-02-01 DIAGNOSIS — F17200 Nicotine dependence, unspecified, uncomplicated: Secondary | ICD-10-CM | POA: Insufficient documentation

## 2013-02-01 DIAGNOSIS — N23 Unspecified renal colic: Secondary | ICD-10-CM | POA: Insufficient documentation

## 2013-02-01 DIAGNOSIS — Z87442 Personal history of urinary calculi: Secondary | ICD-10-CM | POA: Insufficient documentation

## 2013-02-01 LAB — GLUCOSE, CAPILLARY: Glucose-Capillary: 69 mg/dL — ABNORMAL LOW (ref 70–99)

## 2013-02-01 LAB — BASIC METABOLIC PANEL
Chloride: 105 mEq/L (ref 96–112)
GFR calc Af Amer: >90 mL/min (ref >90)
Potassium: 4.2 mEq/L (ref 3.5–5.1)
Sodium: 141 mEq/L (ref 135–145)

## 2013-02-01 LAB — CBC WITH DIFFERENTIAL/PLATELET
Basophils Absolute: 0.1 10*3/uL (ref 0.0–0.1)
Basophils Relative: 1 % (ref 0–1)
Hemoglobin: 14 g/dL (ref 13.0–17.0)
MCHC: 35 g/dL (ref 30.0–36.0)
Monocytes Relative: 9 % (ref 3–12)
Neutro Abs: 4.2 10*3/uL (ref 1.7–7.7)
Neutrophils Relative %: 46 % (ref 43–77)
RDW: 12.8 % (ref 11.5–15.5)

## 2013-02-01 LAB — URINALYSIS, ROUTINE W REFLEX MICROSCOPIC
Specific Gravity, Urine: 1.02 (ref 1.005–1.030)
Urobilinogen, UA: 0.2 mg/dL (ref 0.0–1.0)
pH: 6 (ref 5.0–8.0)

## 2013-02-01 MED ORDER — TAMSULOSIN HCL 0.4 MG PO CAPS
ORAL_CAPSULE | ORAL | Status: AC
  Filled 2013-02-01: qty 1

## 2013-02-01 MED ORDER — HYDROCODONE-ACETAMINOPHEN 5-325 MG PO TABS: 1.0000 | ORAL_TABLET | Freq: Four times a day (QID) | ORAL | Status: DC | PRN

## 2013-02-01 MED ORDER — SODIUM CHLORIDE 0.9 % IV BOLUS (SEPSIS)
500.0000 mL | Freq: Once | INTRAVENOUS | Status: AC
  Administered 2013-02-01: 500 mL via INTRAVENOUS

## 2013-02-01 MED ORDER — SODIUM CHLORIDE 0.9 % IV SOLN
Freq: Once | INTRAVENOUS | Status: AC
  Administered 2013-02-01: 03:00:00 via INTRAVENOUS

## 2013-02-01 MED ORDER — PROMETHAZINE HCL 25 MG/ML IJ SOLN
25.0000 mg | Freq: Once | INTRAMUSCULAR | Status: AC
  Administered 2013-02-01: 25 mg via INTRAVENOUS
  Filled 2013-02-01: qty 1

## 2013-02-01 MED ORDER — DEXTROSE 50 % IV SOLN
INTRAVENOUS | Status: AC
  Filled 2013-02-01: qty 50

## 2013-02-01 MED ORDER — SODIUM CHLORIDE 0.9 % IV BOLUS (SEPSIS)
1000.0000 mL | Freq: Once | INTRAVENOUS | Status: AC
  Administered 2013-02-01: 1000 mL via INTRAVENOUS

## 2013-02-01 MED ORDER — PHENAZOPYRIDINE HCL 200 MG PO TABS: 200.0000 mg | ORAL_TABLET | Freq: Three times a day (TID) | ORAL | Status: DC

## 2013-02-01 MED ORDER — DEXTROSE 50 % IV SOLN: 1.0000 | Freq: Once | INTRAVENOUS | Status: AC

## 2013-02-01 MED ORDER — TAMSULOSIN HCL 0.4 MG PO CAPS
0.4000 mg | ORAL_CAPSULE | Freq: Once | ORAL | Status: AC
  Administered 2013-02-01: 0.4 mg via ORAL
  Filled 2013-02-01: qty 1

## 2013-02-01 MED ORDER — HYDROMORPHONE HCL PF 1 MG/ML IJ SOLN
1.0000 mg | Freq: Once | INTRAMUSCULAR | Status: AC
  Administered 2013-02-01: 1 mg via INTRAVENOUS
  Filled 2013-02-01: qty 1

## 2013-02-01 NOTE — ED Notes (Signed)
Patient c/o left flank pain x 2 days; states had kidney stone 2 weeks ago.

## 2013-02-02 NOTE — ED Provider Notes (Signed)
History     CSN: [id number]  Arrival date & time 02/01/13  0015   First MD Initiated Contact with Patient 02/01/13 0138      Chief Complaint  Patient presents with  . Flank Pain    HPI patient arrives withright flank pain. Patient says he's had previous stones on the right a few weeks ago and over the last 2 days he's had recurrence of a similar type of pain. The pain is in the low right flank, 10 out of 10, associated with nausea and vomiting, as a sharp stabbing pain, it is constant but occasionally gets worse, he says he's not able to afford medications to help treat it. He has not made a follow up with urology. He denies any fevers, chills, chest pain, shortness of breath.  Past Medical History  Diagnosis Date  . Kidney stones     Past Surgical History  Procedure Laterality Date  . Tonsillectomy    . Adenoidectomy    . Tubes in ears      No family history on file.  History  Substance Use Topics  . Smoking status: Current Every Day Smoker    Types: Cigarettes  . Smokeless tobacco: Not on file  . Alcohol Use: No      Review of Systems At least 10pt or greater review of systems completed and are negative except where specified in the HPI.  Allergies  Review of patient's allergies indicates no known allergies.  Home Medications   Current Outpatient Rx  Name  Route  Sig  Dispense  Refill  . HYDROcodone-acetaminophen (NORCO/VICODIN) 5-325 MG per tablet   Oral   Take 1-2 tablets by mouth every 6 (six) hours as needed for pain.   13 tablet   0   . ketorolac (TORADOL) 10 MG tablet   Oral   Take 10 mg by mouth every 6 (six) hours as needed for pain.         Marland Kitchen ondansetron (ZOFRAN ODT) 4 MG disintegrating tablet   Oral   Take 1 tablet (4 mg total) by mouth every 8 (eight) hours as needed for nausea.   20 tablet   0   . opium-belladonna (B&O SUPPRETTES) 16.2-60 MG suppository   Rectal   Place 1 suppository rectally 2 (two) times daily as needed for pain.    20 suppository   0   . oxyCODONE-acetaminophen (PERCOCET/ROXICET) 5-325 MG per tablet   Oral   Take 2 tablets by mouth every 4 (four) hours as needed for pain.   20 tablet   0   . phenazopyridine (PYRIDIUM) 200 MG tablet   Oral   Take 1 tablet (200 mg total) by mouth 3 (three) times daily.   6 tablet   0   . promethazine (PHENERGAN) 25 MG tablet   Oral   Take 25 mg by mouth every 6 (six) hours as needed for nausea.         . tamsulosin (FLOMAX) 0.4 MG CAPS   Oral   Take 1 capsule (0.4 mg total) by mouth daily.   30 capsule   0     BP 131/73  Pulse 76  Temp(Src) 97.7 F (36.5 C) (Oral)  Resp 20  Ht 6' (1.829 m)  Wt 170 lb (77.111 kg)  BMI 23.05 kg/m2  SpO2 100%  Physical Exam  Nursing notes reviewed.  Electronic medical record reviewed. VITAL SIGNS:   Filed Vitals:   02/01/13 0029 02/01/13 0248  BP: 126/88 131/73  Pulse: 76   Temp: 97.7 F (36.5 C)   TempSrc: Oral   Resp: 20   Height: 6' (1.829 m)   Weight: 170 lb (77.111 kg)   SpO2: 100%    CONSTITUTIONAL: Awake, oriented, appears non-toxic HENT: Atraumatic, normocephalic, oral mucosa pink and moist, airway patent. Nares patent without drainage. External ears normal. EYES: Conjunctiva clear, EOMI, PERRLA NECK: Trachea midline, non-tender, supple CARDIOVASCULAR: Normal heart rate, Normal rhythm, No murmurs, rubs, gallops PULMONARY/CHEST: Clear to auscultation, no rhonchi, wheezes, or rales. Symmetrical breath sounds. Non-tender. ABDOMINAL: Non-distended, soft, non-tender - no rebound or guarding.  BS normal. Right flank tenderness to percussion NEUROLOGIC: Non-focal, moving all four extremities, no gross sensory or motor deficits. EXTREMITIES: No clubbing, cyanosis, or edema SKIN: Warm, Dry, No erythema, No rash  ED Course  Procedures (including critical care time)  Labs Reviewed  BASIC METABOLIC PANEL - Abnormal; Notable for the following:    Glucose, Bld 62 (*)    All other components within  normal limits  URINALYSIS, ROUTINE W REFLEX MICROSCOPIC - Abnormal; Notable for the following:    Glucose, UA 250 (*)    All other components within normal limits  GLUCOSE, CAPILLARY - Abnormal; Notable for the following:    Glucose-Capillary 69 (*)    All other components within normal limits  CBC WITH DIFFERENTIAL   Dg Abd 1 View  02/01/2013   *RADIOLOGY REPORT*  Clinical Data: Left-sided flank pain.  History of kidney stones.  ABDOMEN - 1 VIEW  Comparison: No priors.  Findings: Gas and stool are seen scattered throughout the colon extending to the level of the distal rectum.  No pathologic distension of small bowel is noted.  No gross evidence of pneumoperitoneum.  No definite calcifications are noted projecting over either kidney, along the course of either ureter, or over the urinary bladder.  IMPRESSION: 1.  No definite abnormal urinary tract calculi noted on this plain film examination. 2.  Nonobstructive bowel gas pattern.   Original Report Authenticated By: Trudie Reed, M.D.     1. Renal colic on right side       MDM  Patient arrives with possible right-sided stone, KUB does not show this stone, he did have a small stone seen on prior CT.  The patient's urine is unremarkable - no red blood cells seen-is possible the patient is having some ureteral spasm after our ready passing the stone. I do not think an additional CAT scan would change medical disposition or change treatment plan.  We'll treat the patient symptomatically, no indication of infection either. Patient to followup with urology.  I explained the diagnosis and have given explicit precautions to return to the ER including any other new or worsening symptoms. The patient understands and accepts the medical plan as it's been dictated and I have answered their questions. Discharge instructions concerning home care and prescriptions have been given.  The patient is STABLE and is discharged to home in good  condition.       Jones Skene, MD 02/02/13 [phone]

## 2013-08-15 ENCOUNTER — Encounter (HOSPITAL_COMMUNITY): Payer: Self-pay | Admitting: Emergency Medicine

## 2013-08-15 ENCOUNTER — Emergency Department (HOSPITAL_COMMUNITY): Payer: Self-pay

## 2013-08-15 ENCOUNTER — Emergency Department (HOSPITAL_COMMUNITY)
Admission: EM | Admit: 2013-08-15 | Discharge: 2013-08-15 | Disposition: A | Discharge status: Home / Self Care | Payer: Self-pay | Attending: Emergency Medicine | Admitting: Emergency Medicine

## 2013-08-15 DIAGNOSIS — R110 Nausea: Secondary | ICD-10-CM | POA: Insufficient documentation

## 2013-08-15 DIAGNOSIS — R300 Dysuria: Secondary | ICD-10-CM | POA: Insufficient documentation

## 2013-08-15 DIAGNOSIS — R109 Unspecified abdominal pain: Secondary | ICD-10-CM | POA: Insufficient documentation

## 2013-08-15 DIAGNOSIS — F17200 Nicotine dependence, unspecified, uncomplicated: Secondary | ICD-10-CM | POA: Insufficient documentation

## 2013-08-15 DIAGNOSIS — R61 Generalized hyperhidrosis: Secondary | ICD-10-CM | POA: Insufficient documentation

## 2013-08-15 DIAGNOSIS — Z87442 Personal history of urinary calculi: Secondary | ICD-10-CM | POA: Insufficient documentation

## 2013-08-15 LAB — URINALYSIS, ROUTINE W REFLEX MICROSCOPIC
Bilirubin Urine: NEGATIVE
Glucose, UA: NEGATIVE mg/dL
Nitrite: NEGATIVE
Specific Gravity, Urine: 1.025 (ref 1.005–1.030)
pH: 6.5 (ref 5.0–8.0)

## 2013-08-15 LAB — URINE MICROSCOPIC-ADD ON

## 2013-08-15 MED ORDER — HYDROMORPHONE HCL PF 1 MG/ML IJ SOLN
1.0000 mg | Freq: Once | INTRAMUSCULAR | Status: AC
  Administered 2013-08-15: 1 mg via INTRAVENOUS
  Filled 2013-08-15: qty 1

## 2013-08-15 MED ORDER — SODIUM CHLORIDE 0.9 % IV SOLN
Freq: Once | INTRAVENOUS | Status: AC
  Administered 2013-08-15: 21:00:00 via INTRAVENOUS

## 2013-08-15 MED ORDER — KETOROLAC TROMETHAMINE 30 MG/ML IJ SOLN
30.0000 mg | Freq: Once | INTRAMUSCULAR | Status: AC
  Administered 2013-08-15: 30 mg via INTRAVENOUS
  Filled 2013-08-15: qty 1

## 2013-08-15 MED ORDER — OXYCODONE-ACETAMINOPHEN 5-325 MG PO TABS
1.0000 | ORAL_TABLET | Freq: Once | ORAL | Status: AC
  Administered 2013-08-15: 1 via ORAL
  Filled 2013-08-15: qty 1

## 2013-08-15 MED ORDER — OXYCODONE-ACETAMINOPHEN 5-325 MG PO TABS: 1.0000 | ORAL_TABLET | Freq: Four times a day (QID) | ORAL | Status: DC | PRN

## 2013-08-15 NOTE — ED Provider Notes (Addendum)
CSN: [id number]     Arrival date & time 08/15/13  1725 History  This chart was scribed for Gerhard Munch, MD by Luisa Dago, ED Scribe. This patient was seen in room APA18/[facility] and the patient's care was started at 8:34 PM.    Chief Complaint  Patient presents with  . Flank Pain    HPI HPI Comments: Jason Khan is a 22 y.o. male who presents to the Emergency Department complaining of tight waxing and waining left flank pain that started 2 days ago. Pt is complaining of associated nausea, diaphoresis, and dysuria. Pt does have a history of kidney stones. He states that one was removed recently at [facility]. Pt denies chest pain, difficulty breathing and SOB. Pt denies, chest pain, difficulty breathing.     Past Medical History  Diagnosis Date  . Kidney stones    Past Surgical History  Procedure Laterality Date  . Tonsillectomy    . Adenoidectomy    . Tubes in ears     No family history on file. History  Substance Use Topics  . Smoking status: Current Every Day Smoker    Types: Cigarettes  . Smokeless tobacco: Not on file  . Alcohol Use: No    Review of Systems  Constitutional:       Per HPI, otherwise negative  HENT:       Per HPI, otherwise negative  Respiratory:       Per HPI, otherwise negative  Cardiovascular:       Per HPI, otherwise negative  Gastrointestinal: Negative for vomiting.  Endocrine:       Negative aside from HPI  Genitourinary:       Neg aside from HPI   Musculoskeletal:       Per HPI, otherwise negative  Skin: Negative.   Neurological: Negative for syncope.    Allergies  Review of patient's allergies indicates no known allergies.  Home Medications   Current Outpatient Rx  Name  Route  Sig  Dispense  Refill  . acetaminophen (TYLENOL) 500 MG tablet   Oral   Take 500 mg by mouth every 6 (six) hours as needed for mild pain or moderate pain.          BP 146/91  Pulse 90  Temp(Src) 98.1 F (36.7 C) (Oral)  Resp 12  SpO2  97% Physical Exam  Nursing note and vitals reviewed. Constitutional: He is oriented to person, place, and time. He appears well-developed. No distress.  HENT:  Head: Normocephalic and atraumatic.  Eyes: Conjunctivae and EOM are normal.  Cardiovascular: Normal rate and regular rhythm.   Pulmonary/Chest: Effort normal. No stridor. No respiratory distress.  Abdominal: He exhibits no distension. There is tenderness in the left upper quadrant.  Musculoskeletal: He exhibits no edema.  Neurological: He is alert and oriented to person, place, and time.  Skin: Skin is warm and dry.  Psychiatric: He has a normal mood and affect.    ED Course  Procedures (including critical care time)  DIAGNOSTIC STUDIES: Oxygen Saturation is 97% on room air, adequate by my interpretation.    COORDINATION OF CARE: 8:33 PM-Discussed treatment plan which includes  (XR, UA) with pt at bedside and pt agreed to plan.   Labs Review Labs Reviewed  URINALYSIS, ROUTINE W REFLEX MICROSCOPIC   Imaging Review No results found.  EKG Interpretation   None     9:24 PM Patient in discomfort    11:23 PM Patient substantially better. MDM  No diagnosis found.  I personally performed the services described in this documentation, which was scribed in my presence. The recorded information has been reviewed and is accurate.   Patient presents with concern of flank pain.  Patient has history of multiple kidney stones, prior stenting.  On exam the patient is awake alert, afebrile, but in discomfort.  With fluids, analgesics patient improved substantially.  There is no evidence of ongoing infection, and the patient's history has a urologist with whom he can followup.  Patient will call tomorrow for appropriate ongoing care.  With this improvement, no evidence of distress, he was appropriate to conduct evaluation as an outpatient.   Gerhard Munch, MD 08/15/13 9147  Gerhard Munch, MD 08/28/13 [phone]

## 2013-08-15 NOTE — ED Notes (Signed)
Pt c/o left flank pain. Pt has h/s kidney stones.

## 2014-06-05 ENCOUNTER — Emergency Department (HOSPITAL_COMMUNITY)
Admission: EM | Admit: 2014-06-05 | Discharge: 2014-06-05 | Disposition: A | Discharge status: Home / Self Care | Payer: BC Managed Care – PPO | Attending: Emergency Medicine | Admitting: Emergency Medicine

## 2014-06-05 ENCOUNTER — Encounter (HOSPITAL_COMMUNITY): Payer: Self-pay | Admitting: Emergency Medicine

## 2014-06-05 DIAGNOSIS — J069 Acute upper respiratory infection, unspecified: Secondary | ICD-10-CM | POA: Insufficient documentation

## 2014-06-05 DIAGNOSIS — M791 Myalgia: Secondary | ICD-10-CM | POA: Insufficient documentation

## 2014-06-05 DIAGNOSIS — Z720 Tobacco use: Secondary | ICD-10-CM | POA: Insufficient documentation

## 2014-06-05 DIAGNOSIS — Z87442 Personal history of urinary calculi: Secondary | ICD-10-CM | POA: Insufficient documentation

## 2014-06-05 MED ORDER — MAGIC MOUTHWASH W/LIDOCAINE: 5.0000 mL | Freq: Three times a day (TID) | ORAL | Status: DC | PRN

## 2014-06-05 MED ORDER — IBUPROFEN 800 MG PO TABS: 800.0000 mg | ORAL_TABLET | Freq: Three times a day (TID) | ORAL | Status: DC

## 2014-06-05 MED ORDER — GUAIFENESIN-CODEINE 100-10 MG/5ML PO SYRP: 10.0000 mL | ORAL_SOLUTION | Freq: Three times a day (TID) | ORAL | Status: DC | PRN

## 2014-06-05 NOTE — ED Provider Notes (Signed)
CSN: [id number]     Arrival date & time 06/05/14  1351 History   First MD Initiated Contact with Patient 06/05/14 1418     Chief Complaint  Patient presents with  . Cough  . Sore Throat     (Consider location/radiation/quality/duration/timing/severity/associated sxs/prior Treatment) HPI   ARCENIO MULLALY is a 23 y.o. male who presents to the Emergency Department complaining of sore throat and nonproductive cough for 2 days. He states the symptoms became worse this morning. He reports fever and chills this morning. He denies chest pain, shortness of breath, or vomiting. He is taken over-the-counter NyQuil without relief.    Past Medical History  Diagnosis Date  . Kidney stones    Past Surgical History  Procedure Laterality Date  . Tonsillectomy    . Adenoidectomy    . Tubes in ears     No family history on file. History  Substance Use Topics  . Smoking status: Current Every Day Smoker    Types: Cigarettes  . Smokeless tobacco: Not on file  . Alcohol Use: No    Review of Systems  Constitutional: Negative for fever, chills, activity change and appetite change.  HENT: Positive for congestion, sinus pressure and sore throat. Negative for ear pain, facial swelling, rhinorrhea and trouble swallowing.   Eyes: Negative for visual disturbance.  Respiratory: Positive for cough. Negative for chest tightness, shortness of breath, wheezing and stridor.   Gastrointestinal: Negative for nausea, vomiting and abdominal pain.  Musculoskeletal: Positive for myalgias. Negative for neck pain and neck stiffness.  Skin: Negative.  Negative for rash.  Neurological: Negative for dizziness, weakness, numbness and headaches.  Hematological: Negative for adenopathy.  Psychiatric/Behavioral: Negative for confusion.  All other systems reviewed and are negative.     Allergies  Review of patient's allergies indicates no known allergies.  Home Medications   Prior to Admission medications    Medication Sig Start Date End Date Taking? Authorizing Provider  acetaminophen (TYLENOL) 500 MG tablet Take 500 mg by mouth every 6 (six) hours as needed for mild pain or moderate pain.    Historical Provider, MD  oxyCODONE-acetaminophen (PERCOCET/ROXICET) 5-325 MG per tablet Take 1 tablet by mouth every 6 (six) hours as needed for severe pain. 08/15/13   Gerhard Munch, MD   BP 167/87  Pulse 91  Temp(Src) 99.5 F (37.5 C) (Oral)  Resp 17  Ht 6' (1.829 m)  Wt 175 lb (79.379 kg)  BMI 23.73 kg/m2  SpO2 100% Physical Exam  Nursing note and vitals reviewed. Constitutional: He is oriented to person, place, and time. He appears well-developed and well-nourished. No distress.  HENT:  Head: Normocephalic and atraumatic.  Right Ear: Tympanic membrane and ear canal normal.  Left Ear: Tympanic membrane and ear canal normal.  Mouth/Throat: Uvula is midline and mucous membranes are normal. No trismus in the jaw. No uvula swelling. Posterior oropharyngeal erythema present. No oropharyngeal exudate, posterior oropharyngeal edema or tonsillar abscesses.  Eyes: EOM are normal. Pupils are equal, round, and reactive to light.  Neck: Normal range of motion. Neck supple.  Cardiovascular: Normal rate, regular rhythm, normal heart sounds and intact distal pulses.   No murmur heard. Pulmonary/Chest: Effort normal and breath sounds normal. No respiratory distress.  Musculoskeletal: Normal range of motion.  Lymphadenopathy:    He has no cervical adenopathy.  Neurological: He is alert and oriented to person, place, and time. He exhibits normal muscle tone. Coordination normal.  Skin: Skin is warm and dry.    ED  Course  Procedures (including critical care time) Labs Review Labs Reviewed - No data to display  Imaging Review No results found.   EKG Interpretation None      MDM   Final diagnoses:  URI (upper respiratory infection)    Patient is nontoxic appearing. Vital signs are stable.   cardiac tachypnea. Symptoms are likely viral. Patient agrees to symptomatic treatment, increase fluids, ibuprofen every 6 hours as needed for fever. He appears stable for discharge. rx for magic mouthwash, robitussin AC and ibuprofen    Bazil Dhanani L. Trisha Mangleriplett, PA-C 06/07/14 1630

## 2014-06-05 NOTE — ED Notes (Signed)
Pt complaining for aching in joints.

## 2014-06-05 NOTE — Discharge Instructions (Signed)
Cough, Adult ° A cough is a reflex. It helps you clear your throat and airways. A cough can help heal your body. A cough can last 2 or 3 weeks (acute) or may last more than 8 weeks (chronic). Some common causes of a cough can include an infection, allergy, or a cold. °HOME CARE °· Only take medicine as told by your doctor. °· If given, take your medicines (antibiotics) as told. Finish them even if you start to feel better. °· Use a cold steam vaporizer or humidifier in your home. This can help loosen thick spit (secretions). °· Sleep so you are almost sitting up (semi-upright). Use pillows to do this. This helps reduce coughing. °· Rest as needed. °· Stop smoking if you smoke. °GET HELP RIGHT AWAY IF: °· You have yellowish-white fluid (pus) in your thick spit. °· Your cough gets worse. °· Your medicine does not reduce coughing, and you are losing sleep. °· You cough up blood. °· You have trouble breathing. °· Your pain gets worse and medicine does not help. °· You have a fever. °MAKE SURE YOU:  °· Understand these instructions. °· Will watch your condition. °· Will get help right away if you are not doing well or get worse. °Document Released: 04/30/2011 Document Revised: 01/01/2014 Document Reviewed: 04/30/2011 °Exit[employer]® Patient Information ©2015 [employer]. This information is not intended to replace advice given to you by your health care provider. Make sure you discuss any questions you have with your health care provider. ° °Viral Infections °A virus is a type of germ. Viruses can cause: °· Minor sore throats. °· Aches and pains. °· Headaches. °· Runny nose. °· Rashes. °· Watery eyes. °· Tiredness. °· Coughs. °· Loss of appetite. °· Feeling sick to your stomach (nausea). °· Throwing up (vomiting). °· Watery poop (diarrhea). °HOME CARE  °· Only take medicines as told by your doctor. °· Drink enough water and fluids to keep your pee (urine) clear or pale yellow. Sports drinks are a good choice. °· Get plenty  of rest and eat healthy. Soups and broths with crackers or rice are fine. °GET HELP RIGHT AWAY IF:  °· You have a very bad headache. °· You have shortness of breath. °· You have chest pain or neck pain. °· You have an unusual rash. °· You cannot stop throwing up. °· You have watery poop that does not stop. °· You cannot keep fluids down. °· You or your child has a temperature by mouth above 102° F (38.9° C), not controlled by medicine. °· Your baby is older than 3 months with a rectal temperature of 102° F (38.9° C) or higher. °· Your baby is 3 months old or younger with a rectal temperature of 100.4° F (38° C) or higher. °MAKE SURE YOU:  °· Understand these instructions. °· Will watch this condition. °· Will get help right away if you are not doing well or get worse. °Document Released: 07/30/2008 Document Revised: 11/09/2011 Document Reviewed: 12/23/2010 °Exit[employer]® Patient Information ©2015 [employer]. This information is not intended to replace advice given to you by your health care provider. Make sure you discuss any questions you have with your health care provider. ° °

## 2014-06-05 NOTE — ED Notes (Signed)
Patient given discharge instruction, verbalized understand. Patient ambulatory out of the department.  

## 2014-06-09 NOTE — ED Provider Notes (Signed)
Medical screening examination/treatment/procedure(s) were performed by non-physician practitioner and as supervising physician I was immediately available for consultation/collaboration.   EKG Interpretation None       Lynde Ludwig, MD 06/09/14 1058 

## 2014-07-04 ENCOUNTER — Emergency Department (HOSPITAL_COMMUNITY)
Admission: EM | Admit: 2014-07-04 | Discharge: 2014-07-04 | Disposition: A | Discharge status: Home / Self Care | Payer: BC Managed Care – PPO | Attending: Emergency Medicine | Admitting: Emergency Medicine

## 2014-07-04 ENCOUNTER — Encounter (HOSPITAL_COMMUNITY): Payer: Self-pay | Admitting: Emergency Medicine

## 2014-07-04 DIAGNOSIS — Z720 Tobacco use: Secondary | ICD-10-CM | POA: Diagnosis not present

## 2014-07-04 DIAGNOSIS — K088 Other specified disorders of teeth and supporting structures: Secondary | ICD-10-CM | POA: Insufficient documentation

## 2014-07-04 DIAGNOSIS — Z87442 Personal history of urinary calculi: Secondary | ICD-10-CM | POA: Diagnosis not present

## 2014-07-04 DIAGNOSIS — Z79899 Other long term (current) drug therapy: Secondary | ICD-10-CM | POA: Insufficient documentation

## 2014-07-04 DIAGNOSIS — Z791 Long term (current) use of non-steroidal anti-inflammatories (NSAID): Secondary | ICD-10-CM | POA: Insufficient documentation

## 2014-07-04 DIAGNOSIS — K029 Dental caries, unspecified: Secondary | ICD-10-CM | POA: Insufficient documentation

## 2014-07-04 DIAGNOSIS — K0889 Other specified disorders of teeth and supporting structures: Secondary | ICD-10-CM

## 2014-07-04 MED ORDER — HYDROCODONE-ACETAMINOPHEN 5-325 MG PO TABS: 2.0000 | ORAL_TABLET | ORAL | Status: DC | PRN

## 2014-07-04 MED ORDER — HYDROCODONE-ACETAMINOPHEN 5-325 MG PO TABS
2.0000 | ORAL_TABLET | Freq: Once | ORAL | Status: AC
Start: 2014-07-04 — End: 2014-07-04
  Administered 2014-07-04: 2 via ORAL
  Filled 2014-07-04: qty 2

## 2014-07-04 NOTE — ED Notes (Signed)
Patient reports dental pain x 2 weeks. States has been taking ibuprofen for pain, but has no relief.

## 2014-07-04 NOTE — ED Notes (Signed)
Pt seen and eval by EDPa for initial assessment. 

## 2014-07-04 NOTE — Discharge Instructions (Signed)
Dental Caries  Dental caries (also called tooth decay) is the most common oral disease. It can occur at any age but is more common in children and young adults.   HOW DENTAL CARIES DEVELOPS   The process of decay begins when bacteria and foods (particularly sugars and starches) combine in your mouth to produce plaque. Plaque is a substance that sticks to the hard, outer surface of a tooth (enamel). The bacteria in plaque produce acids that attack enamel. These acids may also attack the root surface of a tooth (cementum) if it is exposed. Repeated attacks dissolve these surfaces and create holes in the tooth (cavities). If left untreated, the acids destroy the other layers of the tooth.   RISK FACTORS   Frequent sipping of sugary beverages.    Frequent snacking on sugary and starchy foods, especially those that easily get stuck in the teeth.    Poor oral hygiene.    Dry mouth.    Substance abuse such as methamphetamine abuse.    Broken or poor-fitting dental restorations.    Eating disorders.    Gastroesophageal reflux disease (GERD).    Certain radiation treatments to the head and neck.  SYMPTOMS  In the early stages of dental caries, symptoms are seldom present. Sometimes white, chalky areas may be seen on the enamel or other tooth layers. In later stages, symptoms may include:   Pits and holes on the enamel.   Toothache after sweet, hot, or cold foods or drinks are consumed.   Pain around the tooth.   Swelling around the tooth.  DIAGNOSIS   Most of the time, dental caries is detected during a regular dental checkup. A diagnosis is made after a thorough medical and dental history is taken and the surfaces of your teeth are checked for signs of dental caries. Sometimes special instruments, such as lasers, are used to check for dental caries. Dental X-ray exams may be taken so that areas not visible to the eye (such as between the contact areas of the teeth) can be checked for cavities.    TREATMENT   If dental caries is in its early stages, it may be reversed with a fluoride treatment or an application of a remineralizing agent at the dental office. Thorough brushing and flossing at home is needed to aid these treatments. If it is in its later stages, treatment depends on the location and extent of tooth destruction:    If a small area of the tooth has been destroyed, the destroyed area will be removed and cavities will be filled with a material such as gold, silver amalgam, or composite resin.    If a large area of the tooth has been destroyed, the destroyed area will be removed and a cap (crown) will be fitted over the remaining tooth structure.    If the center part of the tooth (pulp) is affected, a procedure called a root canal will be needed before a filling or crown can be placed.    If most of the tooth has been destroyed, the tooth may need to be pulled (extracted).  HOME CARE INSTRUCTIONS  You can prevent, stop, or reverse dental caries at home by practicing good oral hygiene. Good oral hygiene includes:   Thoroughly cleaning your teeth at least twice a day with a toothbrush and dental floss.    Using a fluoride toothpaste. A fluoride mouth rinse may also be used if recommended by your dentist or health care provider.    Restricting   the amount of sugary and starchy foods and sugary liquids you consume.    Avoiding frequent snacking on these foods and sipping of these liquids.    Keeping regular visits with a dentist for checkups and cleanings.  PREVENTION    Practice good oral hygiene.   Consider a dental sealant. A dental sealant is a coating material that is applied by your dentist to the pits and grooves of teeth. The sealant prevents food from being trapped in them. It may protect the teeth for several years.   Ask about fluoride supplements if you live in a community without fluorinated water or with water that has a low fluoride content. Use fluoride supplements  as directed by your dentist or health care provider.   Allow fluoride varnish applications to teeth if directed by your dentist or health care provider.  Document Released: 05/09/2002 Document Revised: 01/01/2014 Document Reviewed: 08/19/2012  [employer] Patient Information 2015 [employer]. This information is not intended to replace advice given to you by your health care provider. Make sure you discuss any questions you have with your health care provider.

## 2014-07-04 NOTE — ED Provider Notes (Signed)
CSN: [id number]     Arrival date & time 07/04/14  1924 History   First MD Initiated Contact with Patient 07/04/14 1942     Chief Complaint  Patient presents with  . Dental Pain     (Consider location/radiation/quality/duration/timing/severity/associated sxs/prior Treatment) Patient is a 23 y.o. male presenting with tooth pain. The history is provided by the patient. No language interpreter was used.  Dental Pain Location:  Lower Lower teeth location:  19/LL 1st molar Quality:  Aching Severity:  Moderate Onset quality:  Gradual Duration:  2 weeks Timing:  Constant Progression:  Worsening Chronicity:  New Relieved by:  Nothing Worsened by:  Nothing tried Associated symptoms: gum swelling   Risk factors: no diabetes   Pt has rx for clindamycin and ibuprofen.   Pt unable to see dentist until next week.  Past Medical History  Diagnosis Date  . Kidney stones    Past Surgical History  Procedure Laterality Date  . Tonsillectomy    . Adenoidectomy    . Tubes in ears     History reviewed. No pertinent family history. History  Substance Use Topics  . Smoking status: Current Every Day Smoker    Types: Cigarettes  . Smokeless tobacco: Not on file  . Alcohol Use: No    Review of Systems  All other systems reviewed and are negative.     Allergies  Review of patient's allergies indicates no known allergies.  Home Medications   Prior to Admission medications   Medication Sig Start Date End Date Taking? Authorizing Provider  acetaminophen (TYLENOL) 500 MG tablet Take 500 mg by mouth every 6 (six) hours as needed for mild pain or moderate pain.    Historical Provider, MD  Alum & Mag Hydroxide-Simeth (MAGIC MOUTHWASH W/LIDOCAINE) SOLN Take 5 mLs by mouth 3 (three) times daily as needed for mouth pain. 06/05/14   Tammy L. Triplett, PA-C  guaiFENesin-codeine (ROBITUSSIN AC) 100-10 MG/5ML syrup Take 10 mLs by mouth 3 (three) times daily as needed. 06/05/14   Tammy L. Triplett,  PA-C  HYDROcodone-acetaminophen (NORCO/VICODIN) 5-325 MG per tablet Take 2 tablets by mouth every 4 (four) hours as needed. 07/04/14   Elson AreasLeslie K Jaesean Litzau, PA-C  ibuprofen (ADVIL,MOTRIN) 800 MG tablet Take 1 tablet (800 mg total) by mouth 3 (three) times daily. 06/05/14   Tammy L. Triplett, PA-C   BP 161/97 mmHg  Pulse 117  Temp(Src) 99.2 F (37.3 C) (Oral)  Resp 24  Ht 6\' 1"  (1.854 m)  Wt 170 lb (77.111 kg)  BMI 22.43 kg/m2  SpO2 100% Physical Exam  Constitutional: He is oriented to person, place, and time. He appears well-developed and well-nourished.  HENT:  Head: Normocephalic.  Multiple areas of dental decay.   Swollen area left lower gum,    Eyes: EOM are normal.  Neck: Normal range of motion.  Pulmonary/Chest: Effort normal.  Abdominal: He exhibits no distension.  Musculoskeletal: Normal range of motion.  Neurological: He is alert and oriented to person, place, and time.  Psychiatric: He has a normal mood and affect.  Nursing note and vitals reviewed.   ED Course  Procedures (including critical care time) Labs Review Labs Reviewed - No data to display  Imaging Review No results found.   EKG Interpretation None      MDM   Final diagnoses:  Toothache    Continue clindamycin See your dentist for definitive treatment hydrocodone    Elson AreasLeslie K Sabeen Piechocki, PA-C 07/04/14 2025  Flint MelterElliott L Wentz, MD 07/04/14 2312

## 2014-07-07 ENCOUNTER — Emergency Department (HOSPITAL_COMMUNITY)
Admission: EM | Admit: 2014-07-07 | Discharge: 2014-07-07 | Disposition: A | Discharge status: Home / Self Care | Payer: BC Managed Care – PPO | Attending: Emergency Medicine | Admitting: Emergency Medicine

## 2014-07-07 ENCOUNTER — Emergency Department (HOSPITAL_COMMUNITY): Payer: BC Managed Care – PPO

## 2014-07-07 ENCOUNTER — Encounter (HOSPITAL_COMMUNITY): Payer: Self-pay | Admitting: Emergency Medicine

## 2014-07-07 DIAGNOSIS — S199XXA Unspecified injury of neck, initial encounter: Secondary | ICD-10-CM | POA: Diagnosis not present

## 2014-07-07 DIAGNOSIS — S299XXA Unspecified injury of thorax, initial encounter: Secondary | ICD-10-CM | POA: Diagnosis not present

## 2014-07-07 DIAGNOSIS — Z880 Allergy status to penicillin: Secondary | ICD-10-CM | POA: Insufficient documentation

## 2014-07-07 DIAGNOSIS — S022XXA Fracture of nasal bones, initial encounter for closed fracture: Secondary | ICD-10-CM | POA: Insufficient documentation

## 2014-07-07 DIAGNOSIS — Z87442 Personal history of urinary calculi: Secondary | ICD-10-CM | POA: Insufficient documentation

## 2014-07-07 DIAGNOSIS — Z720 Tobacco use: Secondary | ICD-10-CM | POA: Diagnosis not present

## 2014-07-07 DIAGNOSIS — S02401A Maxillary fracture, unspecified, initial encounter for closed fracture: Secondary | ICD-10-CM | POA: Insufficient documentation

## 2014-07-07 DIAGNOSIS — S0990XA Unspecified injury of head, initial encounter: Secondary | ICD-10-CM | POA: Diagnosis present

## 2014-07-07 DIAGNOSIS — S0240CA Maxillary fracture, right side, initial encounter for closed fracture: Secondary | ICD-10-CM

## 2014-07-07 DIAGNOSIS — Z792 Long term (current) use of antibiotics: Secondary | ICD-10-CM | POA: Insufficient documentation

## 2014-07-07 DIAGNOSIS — S3992XA Unspecified injury of lower back, initial encounter: Secondary | ICD-10-CM | POA: Diagnosis not present

## 2014-07-07 DIAGNOSIS — S01411A Laceration without foreign body of right cheek and temporomandibular area, initial encounter: Secondary | ICD-10-CM | POA: Insufficient documentation

## 2014-07-07 DIAGNOSIS — T07XXXA Unspecified multiple injuries, initial encounter: Secondary | ICD-10-CM

## 2014-07-07 MED ORDER — CEFAZOLIN SODIUM 1-5 GM-% IV SOLN
1.0000 g | Freq: Once | INTRAVENOUS | Status: DC
  Filled 2014-07-07: qty 50

## 2014-07-07 MED ORDER — LIDOCAINE-EPINEPHRINE (PF) 1 %-1:200000 IJ SOLN
INTRAMUSCULAR | Status: AC
  Administered 2014-07-07: 07:00:00
  Filled 2014-07-07: qty 10

## 2014-07-07 MED ORDER — ONDANSETRON HCL 4 MG/2ML IJ SOLN
4.0000 mg | Freq: Once | INTRAMUSCULAR | Status: AC
  Administered 2014-07-07: 4 mg via INTRAVENOUS
  Filled 2014-07-07: qty 2

## 2014-07-07 MED ORDER — AMOXICILLIN 500 MG PO CAPS: 500.0000 mg | ORAL_CAPSULE | Freq: Three times a day (TID) | ORAL | Status: DC

## 2014-07-07 MED ORDER — FENTANYL CITRATE 0.05 MG/ML IJ SOLN
100.0000 ug | Freq: Once | INTRAMUSCULAR | Status: AC
  Administered 2014-07-07: 100 ug via INTRAVENOUS
  Filled 2014-07-07: qty 2

## 2014-07-07 MED ORDER — MORPHINE SULFATE 4 MG/ML IJ SOLN
4.0000 mg | Freq: Once | INTRAMUSCULAR | Status: AC
  Administered 2014-07-07: 4 mg via INTRAVENOUS
  Filled 2014-07-07: qty 1

## 2014-07-07 MED ORDER — CEFAZOLIN SODIUM 1-5 GM-% IV SOLN
1.0000 g | Freq: Once | INTRAVENOUS | Status: AC
  Administered 2014-07-07: 1 g via INTRAVENOUS

## 2014-07-07 MED ORDER — NICOTINE 21 MG/24HR TD PT24
21.0000 mg | MEDICATED_PATCH | Freq: Once | TRANSDERMAL | Status: DC
  Administered 2014-07-07: 21 mg via TRANSDERMAL
  Filled 2014-07-07: qty 1

## 2014-07-07 MED ORDER — OXYCODONE-ACETAMINOPHEN 5-325 MG PO TABS: 1.0000 | ORAL_TABLET | Freq: Four times a day (QID) | ORAL | Status: DC | PRN

## 2014-07-07 MED ORDER — HYDROMORPHONE HCL 1 MG/ML IJ SOLN
1.0000 mg | Freq: Once | INTRAMUSCULAR | Status: AC
Start: 2014-07-07 — End: 2014-07-07
  Administered 2014-07-07: 1 mg via INTRAVENOUS
  Filled 2014-07-07: qty 1

## 2014-07-07 MED ORDER — POVIDONE-IODINE 10 % EX SOLN
CUTANEOUS | Status: AC
  Administered 2014-07-07: 07:00:00
  Filled 2014-07-07: qty 118

## 2014-07-07 NOTE — ED Provider Notes (Signed)
CT read negative. Patient be discharged home. Given additional IV medications. Awake and alert.  Rolland PorterMark Lorry Furber, MD 07/07/14 [phone]
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
## 2014-07-08 DIAGNOSIS — S02401S Maxillary fracture, unspecified, sequela: Secondary | ICD-10-CM | POA: Insufficient documentation

## 2014-07-08 DIAGNOSIS — Z87442 Personal history of urinary calculi: Secondary | ICD-10-CM | POA: Diagnosis not present

## 2014-07-08 DIAGNOSIS — Z5189 Encounter for other specified aftercare: Secondary | ICD-10-CM | POA: Diagnosis present

## 2014-07-08 DIAGNOSIS — Z720 Tobacco use: Secondary | ICD-10-CM | POA: Diagnosis not present

## 2014-07-09 ENCOUNTER — Emergency Department (HOSPITAL_COMMUNITY)
Admission: EM | Admit: 2014-07-09 | Discharge: 2014-07-09 | Disposition: A | Discharge status: Home / Self Care | Payer: BC Managed Care – PPO | Attending: Emergency Medicine | Admitting: Emergency Medicine

## 2014-07-09 ENCOUNTER — Encounter (HOSPITAL_COMMUNITY): Payer: Self-pay

## 2014-07-09 DIAGNOSIS — S0240CS Maxillary fracture, right side, sequela: Secondary | ICD-10-CM

## 2014-07-09 MED ORDER — OXYCODONE-ACETAMINOPHEN 5-325 MG PO TABS: 1.0000 | ORAL_TABLET | Freq: Three times a day (TID) | ORAL | Status: DC | PRN

## 2014-07-09 MED ORDER — OXYCODONE-ACETAMINOPHEN 5-325 MG PO TABS
2.0000 | ORAL_TABLET | Freq: Once | ORAL | Status: AC
  Administered 2014-07-09: 2 via ORAL
  Filled 2014-07-09: qty 2
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
## 2014-11-24 ENCOUNTER — Emergency Department (HOSPITAL_COMMUNITY): Payer: Self-pay

## 2014-11-24 ENCOUNTER — Encounter (HOSPITAL_COMMUNITY): Payer: Self-pay | Admitting: Emergency Medicine

## 2014-11-24 ENCOUNTER — Emergency Department (HOSPITAL_COMMUNITY)
Admission: EM | Admit: 2014-11-24 | Discharge: 2014-11-24 | Disposition: A | Discharge status: Home / Self Care | Payer: Self-pay | Attending: Emergency Medicine | Admitting: Emergency Medicine

## 2014-11-24 DIAGNOSIS — Z720 Tobacco use: Secondary | ICD-10-CM | POA: Insufficient documentation

## 2014-11-24 DIAGNOSIS — Z792 Long term (current) use of antibiotics: Secondary | ICD-10-CM | POA: Insufficient documentation

## 2014-11-24 DIAGNOSIS — R109 Unspecified abdominal pain: Secondary | ICD-10-CM | POA: Insufficient documentation

## 2014-11-24 DIAGNOSIS — Z87442 Personal history of urinary calculi: Secondary | ICD-10-CM | POA: Insufficient documentation

## 2014-11-24 LAB — URINALYSIS, ROUTINE W REFLEX MICROSCOPIC
Bilirubin Urine: NEGATIVE
Glucose, UA: NEGATIVE mg/dL
HGB URINE DIPSTICK: NEGATIVE
Ketones, ur: NEGATIVE mg/dL
LEUKOCYTES UA: NEGATIVE
NITRITE: NEGATIVE
PH: 7.5 (ref 5.0–8.0)
Protein, ur: NEGATIVE mg/dL
Specific Gravity, Urine: 1.01 (ref 1.005–1.030)
Urobilinogen, UA: 0.2 mg/dL (ref 0.0–1.0)

## 2014-11-24 LAB — CBC WITH DIFFERENTIAL/PLATELET
BASOS ABS: 0 10*3/uL (ref 0.0–0.1)
Basophils Relative: 0 % (ref 0–1)
EOS ABS: 0.2 10*3/uL (ref 0.0–0.7)
Eosinophils Relative: 2 % (ref 0–5)
HEMATOCRIT: 46.2 % (ref 39.0–52.0)
Hemoglobin: 16.1 g/dL (ref 13.0–17.0)
LYMPHS PCT: 28 % (ref 12–46)
Lymphs Abs: 2.8 10*3/uL (ref 0.7–4.0)
MCH: 31.7 pg (ref 26.0–34.0)
MCHC: 34.8 g/dL (ref 30.0–36.0)
MCV: 90.9 fL (ref 78.0–100.0)
MONOS PCT: 7 % (ref 3–12)
Monocytes Absolute: 0.7 10*3/uL (ref 0.1–1.0)
NEUTROS PCT: 63 % (ref 43–77)
Neutro Abs: 6.3 10*3/uL (ref 1.7–7.7)
Platelets: 284 10*3/uL (ref 150–400)
RBC: 5.08 MIL/uL (ref 4.22–5.81)
RDW: 12.6 % (ref 11.5–15.5)
WBC: 10 10*3/uL (ref 4.0–10.5)

## 2014-11-24 LAB — COMPREHENSIVE METABOLIC PANEL
ALK PHOS: 71 U/L (ref 39–117)
ALT: 20 U/L (ref 0–53)
AST: 22 U/L (ref 0–37)
Albumin: 5.1 g/dL (ref 3.5–5.2)
Anion gap: 9 (ref 5–15)
BUN: 16 mg/dL (ref 6–23)
CHLORIDE: 105 mmol/L (ref 96–112)
CO2: 26 mmol/L (ref 19–32)
Calcium: 9.4 mg/dL (ref 8.4–10.5)
Creatinine, Ser: 1.08 mg/dL (ref 0.50–1.35)
GFR calc Af Amer: >90 mL/min (ref >90)
GFR calc non Af Amer: >90 mL/min (ref >90)
GLUCOSE: 94 mg/dL (ref 70–99)
POTASSIUM: 3.6 mmol/L (ref 3.5–5.1)
SODIUM: 140 mmol/L (ref 135–145)
Total Bilirubin: 0.7 mg/dL (ref 0.3–1.2)
Total Protein: 8.3 g/dL (ref 6.0–8.3)

## 2014-11-24 MED ORDER — PROMETHAZINE HCL 25 MG PO TABS: 25.0000 mg | ORAL_TABLET | Freq: Four times a day (QID) | ORAL | Status: DC | PRN

## 2014-11-24 MED ORDER — KETOROLAC TROMETHAMINE 30 MG/ML IJ SOLN
30.0000 mg | Freq: Once | INTRAMUSCULAR | Status: AC
  Administered 2014-11-24: 30 mg via INTRAVENOUS
  Filled 2014-11-24: qty 1

## 2014-11-24 MED ORDER — ONDANSETRON HCL 4 MG/2ML IJ SOLN
4.0000 mg | Freq: Once | INTRAMUSCULAR | Status: AC
  Administered 2014-11-24: 4 mg via INTRAVENOUS
  Filled 2014-11-24: qty 2

## 2014-11-24 MED ORDER — HYDROCODONE-ACETAMINOPHEN 5-325 MG PO TABS: 1.0000 | ORAL_TABLET | Freq: Four times a day (QID) | ORAL | Status: DC | PRN

## 2014-11-24 MED ORDER — HYDROMORPHONE HCL 1 MG/ML IJ SOLN
1.0000 mg | Freq: Once | INTRAMUSCULAR | Status: AC
  Administered 2014-11-24: 1 mg via INTRAVENOUS
  Filled 2014-11-24: qty 1

## 2014-11-24 MED ORDER — LORAZEPAM 2 MG/ML IJ SOLN
0.5000 mg | Freq: Once | INTRAMUSCULAR | Status: AC
  Administered 2014-11-24: 0.5 mg via INTRAVENOUS
  Filled 2014-11-24: qty 1

## 2014-11-24 NOTE — ED Provider Notes (Signed)
CSN: [id number]     Arrival date & time 11/24/14  1614 History   First MD Initiated Contact with Patient 11/24/14 1622     Chief Complaint  Patient presents with  . Flank Pain     (Consider location/radiation/quality/duration/timing/severity/associated sxs/prior Treatment) Patient is a 24 y.o. male presenting with flank pain. The history is provided by the patient (pt complains of left flank pain.  pt has a hx of kidney stones).  Flank Pain This is a new problem. The current episode started 2 days ago. The problem occurs constantly. The problem has not changed since onset.Pertinent negatives include no chest pain, no abdominal pain, no headaches and no shortness of breath. Nothing aggravates the symptoms.    Past Medical History  Diagnosis Date  . Kidney stones    Past Surgical History  Procedure Laterality Date  . Tonsillectomy    . Adenoidectomy    . Tubes in ears     Family History  Problem Relation Age of Onset  . Heart failure Father   . Heart failure Other    History  Substance Use Topics  . Smoking status: Current Every Day Smoker -- 1.00 packs/day    Types: Cigarettes  . Smokeless tobacco: Never Used  . Alcohol Use: 1.2 oz/week    2 Cans of beer per week    Review of Systems  Constitutional: Negative for appetite change and fatigue.  HENT: Negative for congestion, ear discharge and sinus pressure.   Eyes: Negative for discharge.  Respiratory: Negative for cough and shortness of breath.   Cardiovascular: Negative for chest pain.  Gastrointestinal: Negative for abdominal pain and diarrhea.  Genitourinary: Positive for flank pain. Negative for frequency and hematuria.  Musculoskeletal: Negative for back pain.  Skin: Negative for rash.  Neurological: Negative for seizures and headaches.  Psychiatric/Behavioral: Negative for hallucinations.      Allergies  Review of patient's allergies indicates no known allergies.  Home Medications   Prior to Admission  medications   Medication Sig Start Date End Date Taking? Authorizing Provider  amoxicillin (AMOXIL) 500 MG capsule Take 1 capsule (500 mg total) by mouth 3 (three) times daily. 07/07/14   John Molpus, MD  HYDROcodone-acetaminophen (NORCO/VICODIN) 5-325 MG per tablet Take 1 tablet by mouth every 6 (six) hours as needed. 11/24/14   Bethann BerkshireJoseph Gitty Osterlund, MD  oxyCODONE-acetaminophen (PERCOCET/ROXICET) 5-325 MG per tablet Take 1 tablet by mouth every 8 (eight) hours as needed for severe pain. 07/09/14   Zadie Rhineonald Wickline, MD  promethazine (PHENERGAN) 25 MG tablet Take 1 tablet (25 mg total) by mouth every 6 (six) hours as needed for nausea or vomiting. 11/24/14   Bethann BerkshireJoseph Leith Szafranski, MD   BP 161/102 mmHg  Pulse 69  Temp(Src) 98.6 F (37 C) (Oral)  Resp 16  Ht 6' (1.829 m)  Wt 180 lb (81.647 kg)  BMI 24.41 kg/m2  SpO2 98% Physical Exam  Constitutional: He is oriented to person, place, and time. He appears well-developed.  HENT:  Head: Normocephalic.  Eyes: Conjunctivae and EOM are normal. No scleral icterus.  Neck: Neck supple. No thyromegaly present.  Cardiovascular: Normal rate and regular rhythm.  Exam reveals no gallop and no friction rub.   No murmur heard. Pulmonary/Chest: No stridor. He has no wheezes. He has no rales. He exhibits no tenderness.  Abdominal: He exhibits no distension. There is no tenderness. There is no rebound.  Genitourinary:  Tender left flank  Musculoskeletal: Normal range of motion. He exhibits no edema.  Lymphadenopathy:  He has no cervical adenopathy.  Neurological: He is oriented to person, place, and time. He exhibits normal muscle tone. Coordination normal.  Skin: No rash noted. No erythema.  Psychiatric: He has a normal mood and affect. His behavior is normal.    ED Course  Procedures (including critical care time) Labs Review Labs Reviewed  URINALYSIS, ROUTINE W REFLEX MICROSCOPIC  CBC WITH DIFFERENTIAL/PLATELET  COMPREHENSIVE METABOLIC PANEL    Imaging  Review Dg Abd 1 View  11/24/2014   CLINICAL DATA:  Left flank pain and left lower quadrant pain.  EXAM: ABDOMEN - 1 VIEW  COMPARISON:  08/15/2013.  FINDINGS: The bowel gas pattern is unremarkable. The soft tissue shadows of the abdomen are maintained. No obvious renal or ureteral calculi. The bony structures are intact.  IMPRESSION: Unremarkable abdominal radiograph.   Electronically Signed   By: Rudie Meyer M.D.   On: 11/24/2014 17:33     EKG Interpretation None      MDM   Final diagnoses:  Flank pain    Flank pain,  Will follow up with urology      Bethann Berkshire, MD 11/24/14 [phone]

## 2014-11-24 NOTE — ED Notes (Signed)
Pt reports R flank pain, pulling sensation in his groin and nausea. Pt has prior hx of mult kidney stones.

## 2014-11-24 NOTE — ED Notes (Signed)
Patient with no complaints at this time. Respirations even and unlabored. Skin warm/dry. Discharge instructions reviewed with patient at this time. Patient given opportunity to voice concerns/ask questions. IV removed per policy and band-aid applied to site. Patient discharged at this time and left Emergency Department with steady gait.  

## 2014-11-24 NOTE — Discharge Instructions (Signed)
Follow up with [facility] in 1-2 weeks °

## 2015-02-01 ENCOUNTER — Emergency Department (HOSPITAL_COMMUNITY)
Admission: EM | Admit: 2015-02-01 | Discharge: 2015-02-01 | Disposition: A | Discharge status: Home / Self Care | Payer: Self-pay | Attending: Emergency Medicine | Admitting: Emergency Medicine

## 2015-02-01 ENCOUNTER — Encounter (HOSPITAL_COMMUNITY): Payer: Self-pay | Admitting: Emergency Medicine

## 2015-02-01 DIAGNOSIS — Z792 Long term (current) use of antibiotics: Secondary | ICD-10-CM | POA: Insufficient documentation

## 2015-02-01 DIAGNOSIS — R109 Unspecified abdominal pain: Secondary | ICD-10-CM

## 2015-02-01 DIAGNOSIS — Z87442 Personal history of urinary calculi: Secondary | ICD-10-CM | POA: Insufficient documentation

## 2015-02-01 DIAGNOSIS — Z720 Tobacco use: Secondary | ICD-10-CM | POA: Insufficient documentation

## 2015-02-01 DIAGNOSIS — Z79899 Other long term (current) drug therapy: Secondary | ICD-10-CM | POA: Insufficient documentation

## 2015-02-01 DIAGNOSIS — R319 Hematuria, unspecified: Secondary | ICD-10-CM | POA: Insufficient documentation

## 2015-02-01 LAB — URINALYSIS, ROUTINE W REFLEX MICROSCOPIC
BILIRUBIN URINE: NEGATIVE
Glucose, UA: NEGATIVE mg/dL
Ketones, ur: NEGATIVE mg/dL
Leukocytes, UA: NEGATIVE
NITRITE: NEGATIVE
PROTEIN: NEGATIVE mg/dL
UROBILINOGEN UA: 0.2 mg/dL (ref 0.0–1.0)
pH: 5.5 (ref 5.0–8.0)

## 2015-02-01 LAB — BASIC METABOLIC PANEL
Anion gap: 9 (ref 5–15)
BUN: 16 mg/dL (ref 6–20)
CHLORIDE: 108 mmol/L (ref 101–111)
CO2: 22 mmol/L (ref 22–32)
Calcium: 8.5 mg/dL — ABNORMAL LOW (ref 8.9–10.3)
Creatinine, Ser: 1.11 mg/dL (ref 0.61–1.24)
Glucose, Bld: 100 mg/dL — ABNORMAL HIGH (ref 65–99)
Potassium: 3.4 mmol/L — ABNORMAL LOW (ref 3.5–5.1)
Sodium: 139 mmol/L (ref 135–145)

## 2015-02-01 LAB — URINE MICROSCOPIC-ADD ON

## 2015-02-01 MED ORDER — HYDROCODONE-ACETAMINOPHEN 5-325 MG PO TABS: 1.0000 | ORAL_TABLET | ORAL | Status: DC | PRN

## 2015-02-01 MED ORDER — KETOROLAC TROMETHAMINE 30 MG/ML IJ SOLN
INTRAMUSCULAR | Status: AC
  Administered 2015-02-01: 15 mg
  Filled 2015-02-01: qty 1

## 2015-02-01 MED ORDER — HYDROMORPHONE HCL 1 MG/ML IJ SOLN
1.0000 mg | Freq: Once | INTRAMUSCULAR | Status: AC
  Administered 2015-02-01: 1 mg via INTRAVENOUS
  Filled 2015-02-01: qty 1

## 2015-02-01 MED ORDER — KETOROLAC TROMETHAMINE 15 MG/ML IJ SOLN
15.0000 mg | Freq: Once | INTRAMUSCULAR | Status: AC
  Filled 2015-02-01: qty 1

## 2015-02-01 NOTE — ED Notes (Signed)
Pt reports right flank pain,dark urine x2 days. Pt reports history of same since age 24. nad noted.

## 2015-02-01 NOTE — ED Notes (Signed)
Patient given discharge instruction, verbalized understand. IV removed, band aid applied. Patient ambulatory out of the department.  

## 2015-02-01 NOTE — Discharge Instructions (Signed)
If you were given medicines take as directed.  If you are on coumadin or contraceptives realize their levels and effectiveness is altered by many different medicines.  If you have any reaction (rash, tongues swelling, other) to the medicines stop taking and see a physician.   Take ibuprofen every 6 hrs for pain. For severe pain take norco or vicodin however realize they have the potential for addiction and it can make you sleepy and has tylenol in it.  No operating machinery while taking.  If your blood pressure was elevated in the ER make sure you follow up for management with a primary doctor or return for chest pain, shortness of breath or stroke symptoms.  Please follow up as directed and return to the ER or see a physician for new or worsening symptoms.  Thank you. Filed Vitals:   02/01/15 1757  BP: 153/103  Pulse: 66  Temp: 97.9 F (36.6 C)  TempSrc: Oral  Resp: 20  Height: 6' (1.829 m)  Weight: 190 lb (86.183 kg)  SpO2: 100%

## 2015-02-01 NOTE — ED Provider Notes (Signed)
CSN: [id number]     Arrival date & time 02/01/15  1757 History   First MD Initiated Contact with Patient 02/01/15 1806     Chief Complaint  Patient presents with  . Flank Pain     (Consider location/radiation/quality/duration/timing/severity/associated sxs/prior Treatment) HPI Comments: 24 year old male with history of multiple kidney stones since her young age, stent placement in the past presents with recurrent right flank pain radiating to the groin, feels identical to previous kidney stones. Started yesterday sudden onset. No fevers or chills. No other kidney problems. Patient tried over-the-counter meds with minimal relief. Patient is a smoker  Patient is a 24 y.o. male presenting with flank pain. The history is provided by the patient.  Flank Pain Pertinent negatives include no chest pain, no abdominal pain, no headaches and no shortness of breath.    Past Medical History  Diagnosis Date  . Kidney stones    Past Surgical History  Procedure Laterality Date  . Tonsillectomy    . Adenoidectomy    . Tubes in ears     Family History  Problem Relation Age of Onset  . Heart failure Father   . Heart failure Other    History  Substance Use Topics  . Smoking status: Current Every Day Smoker -- 1.00 packs/day    Types: Cigarettes  . Smokeless tobacco: Never Used  . Alcohol Use: Yes     Comment: occasional    Review of Systems  Constitutional: Negative for fever and chills.  HENT: Negative for congestion.   Eyes: Negative for visual disturbance.  Respiratory: Negative for shortness of breath.   Cardiovascular: Negative for chest pain.  Gastrointestinal: Negative for vomiting and abdominal pain.  Genitourinary: Positive for hematuria and flank pain. Negative for dysuria.  Musculoskeletal: Negative for back pain, neck pain and neck stiffness.  Skin: Negative for rash.  Neurological: Negative for light-headedness and headaches.      Allergies  Review of patient's  allergies indicates no known allergies.  Home Medications   Prior to Admission medications   Medication Sig Start Date End Date Taking? Authorizing Provider  amoxicillin (AMOXIL) 500 MG capsule Take 1 capsule (500 mg total) by mouth 3 (three) times daily. 07/07/14   John Molpus, MD  HYDROcodone-acetaminophen (NORCO) 5-325 MG per tablet Take 1-2 tablets by mouth every 4 (four) hours as needed. 02/01/15   Blane Ohara, MD  HYDROcodone-acetaminophen (NORCO/VICODIN) 5-325 MG per tablet Take 1 tablet by mouth every 6 (six) hours as needed. 11/24/14   Bethann Berkshire, MD  oxyCODONE-acetaminophen (PERCOCET/ROXICET) 5-325 MG per tablet Take 1 tablet by mouth every 8 (eight) hours as needed for severe pain. 07/09/14   Zadie Rhine, MD  promethazine (PHENERGAN) 25 MG tablet Take 1 tablet (25 mg total) by mouth every 6 (six) hours as needed for nausea or vomiting. 11/24/14   Bethann Berkshire, MD   BP 153/103 mmHg  Pulse 66  Temp(Src) 97.9 F (36.6 C) (Oral)  Resp 20  Ht 6' (1.829 m)  Wt 190 lb (86.183 kg)  BMI 25.76 kg/m2  SpO2 100% Physical Exam  Constitutional: He is oriented to person, place, and time. He appears well-developed and well-nourished.  HENT:  Head: Normocephalic and atraumatic.  Eyes: Conjunctivae are normal. Right eye exhibits no discharge. Left eye exhibits no discharge.  Neck: Normal range of motion. Neck supple. No tracheal deviation present.  Cardiovascular: Normal rate and regular rhythm.   Pulmonary/Chest: Effort normal and breath sounds normal.  Abdominal: Soft. He exhibits no distension. There  is no tenderness. There is no guarding.  Musculoskeletal: He exhibits no edema.  Neurological: He is alert and oriented to person, place, and time.  Skin: Skin is warm. No rash noted.  Psychiatric: He has a normal mood and affect.  Nursing note and vitals reviewed.   ED Course  Procedures (including critical care time) Emergency Focused Ultrasound Exam Limited retroperitoneal  ultrasound of kidneys  Performed and interpreted by Dr. Jodi MourningZavitz Indication: flank pain Focused abdominal ultrasound with both kidneys imaged in transverse and longitudinal planes in real-time. Interpretation: no hydronephrosis visualized.   Images archived electronically  CPT Code: [id number] (limited retroperitoneal)  Labs Review Labs Reviewed  URINALYSIS, ROUTINE W REFLEX MICROSCOPIC (NOT AT [facility]) - Abnormal; Notable for the following:    Specific Gravity, Urine >1.030 (*)    Hgb urine dipstick LARGE (*)    All other components within normal limits  BASIC METABOLIC PANEL - Abnormal; Notable for the following:    Potassium 3.4 (*)    Glucose, Bld 100 (*)    Calcium 8.5 (*)    All other components within normal limits  URINE MICROSCOPIC-ADD ON    Imaging Review No results found.   EKG Interpretation None      MDM   Final diagnoses:  Acute right flank pain  Hematuria   Overall well-appearing patient with multiple kidney stone history presents with clinical concern for recurrent kidney stone. Discussed risk of radiation and bedside ultrasound performed no significant hydronephrosis seen. Plan for urinalysis kidney function and close follow-up with urology. Patient admits he has not followed up regularly with urology and he understands importance of this.  Results and differential diagnosis were discussed with the patient/parent/guardian. Close follow up outpatient was discussed, comfortable with the plan.   Medications  ketorolac (TORADOL) 15 MG/ML injection 15 mg (not administered)  HYDROmorphone (DILAUDID) injection 1 mg (1 mg Intravenous Given 02/01/15 1834)    Filed Vitals:   02/01/15 1757  BP: 153/103  Pulse: 66  Temp: 97.9 F (36.6 C)  TempSrc: Oral  Resp: 20  Height: 6' (1.829 m)  Weight: 190 lb (86.183 kg)  SpO2: 100%    Final diagnoses:  Acute right flank pain  Hematuria        Blane OharaJoshua Zairah Arista, MD 02/01/15 [phone]

## 2015-03-30 ENCOUNTER — Emergency Department (HOSPITAL_COMMUNITY)
Admission: EM | Admit: 2015-03-30 | Discharge: 2015-03-30 | Disposition: A | Discharge status: Home / Self Care | Payer: Self-pay | Attending: Emergency Medicine | Admitting: Emergency Medicine

## 2015-03-30 ENCOUNTER — Emergency Department (HOSPITAL_COMMUNITY): Payer: Self-pay

## 2015-03-30 ENCOUNTER — Encounter (HOSPITAL_COMMUNITY): Payer: Self-pay | Admitting: Emergency Medicine

## 2015-03-30 DIAGNOSIS — R109 Unspecified abdominal pain: Secondary | ICD-10-CM | POA: Insufficient documentation

## 2015-03-30 DIAGNOSIS — Z720 Tobacco use: Secondary | ICD-10-CM | POA: Insufficient documentation

## 2015-03-30 DIAGNOSIS — Z79899 Other long term (current) drug therapy: Secondary | ICD-10-CM | POA: Insufficient documentation

## 2015-03-30 DIAGNOSIS — Z87442 Personal history of urinary calculi: Secondary | ICD-10-CM | POA: Insufficient documentation

## 2015-03-30 LAB — CBC WITH DIFFERENTIAL/PLATELET
Basophils Absolute: 0.1 10*3/uL (ref 0.0–0.1)
Basophils Relative: 1 % (ref 0–1)
EOS ABS: 0.2 10*3/uL (ref 0.0–0.7)
Eosinophils Relative: 2 % (ref 0–5)
HEMATOCRIT: 42.2 % (ref 39.0–52.0)
Hemoglobin: 14.3 g/dL (ref 13.0–17.0)
Lymphocytes Relative: 35 % (ref 12–46)
Lymphs Abs: 2.6 10*3/uL (ref 0.7–4.0)
MCH: 31.4 pg (ref 26.0–34.0)
MCHC: 33.9 g/dL (ref 30.0–36.0)
MCV: 92.5 fL (ref 78.0–100.0)
MONO ABS: 0.7 10*3/uL (ref 0.1–1.0)
Monocytes Relative: 10 % (ref 3–12)
Neutro Abs: 3.8 10*3/uL (ref 1.7–7.7)
Neutrophils Relative %: 52 % (ref 43–77)
PLATELETS: 264 10*3/uL (ref 150–400)
RBC: 4.56 MIL/uL (ref 4.22–5.81)
RDW: 13.7 % (ref 11.5–15.5)
WBC: 7.4 10*3/uL (ref 4.0–10.5)

## 2015-03-30 LAB — URINALYSIS, ROUTINE W REFLEX MICROSCOPIC
Bilirubin Urine: NEGATIVE
Glucose, UA: NEGATIVE mg/dL
HGB URINE DIPSTICK: NEGATIVE
Ketones, ur: NEGATIVE mg/dL
Leukocytes, UA: NEGATIVE
Nitrite: NEGATIVE
PROTEIN: NEGATIVE mg/dL
Specific Gravity, Urine: 1.01 (ref 1.005–1.030)
UROBILINOGEN UA: 0.2 mg/dL (ref 0.0–1.0)
pH: 6.5 (ref 5.0–8.0)

## 2015-03-30 LAB — I-STAT CHEM 8, ED
BUN: 7 mg/dL (ref 6–20)
CALCIUM ION: 1.13 mmol/L (ref 1.12–1.23)
CHLORIDE: 104 mmol/L (ref 101–111)
Creatinine, Ser: 1.1 mg/dL (ref 0.61–1.24)
Glucose, Bld: 96 mg/dL (ref 65–99)
HCT: 45 % (ref 39.0–52.0)
Hemoglobin: 15.3 g/dL (ref 13.0–17.0)
POTASSIUM: 3.5 mmol/L (ref 3.5–5.1)
Sodium: 140 mmol/L (ref 135–145)
TCO2: 23 mmol/L (ref 0–100)

## 2015-03-30 MED ORDER — MORPHINE SULFATE 4 MG/ML IJ SOLN
4.0000 mg | Freq: Once | INTRAMUSCULAR | Status: AC
  Administered 2015-03-30: 4 mg via INTRAVENOUS
  Filled 2015-03-30: qty 1

## 2015-03-30 MED ORDER — ONDANSETRON HCL 4 MG/2ML IJ SOLN
4.0000 mg | Freq: Once | INTRAMUSCULAR | Status: AC
  Administered 2015-03-30: 4 mg via INTRAVENOUS
  Filled 2015-03-30: qty 2

## 2015-03-30 MED ORDER — HYDROMORPHONE HCL 1 MG/ML IJ SOLN
1.0000 mg | Freq: Once | INTRAMUSCULAR | Status: AC
  Administered 2015-03-30: 1 mg via INTRAVENOUS
  Filled 2015-03-30: qty 1

## 2015-03-30 MED ORDER — KETOROLAC TROMETHAMINE 30 MG/ML IJ SOLN: 30.0000 mg | Freq: Once | INTRAMUSCULAR | Status: DC

## 2015-03-30 NOTE — ED Notes (Signed)
Patient transported to CT 

## 2015-03-30 NOTE — Discharge Instructions (Signed)
Flank Pain °Flank pain refers to pain that is located on the side of the body between the upper abdomen and the back. The pain may occur over a short period of time (acute) or may be long-term or reoccurring (chronic). It may be mild or severe. Flank pain can be caused by many things. °CAUSES  °Some of the more common causes of flank pain include: °· Muscle strains.   °· Muscle spasms.   °· A disease of your spine (vertebral disk disease).   °· A lung infection (pneumonia).   °· Fluid around your lungs (pulmonary edema).   °· A kidney infection.   °· Kidney stones.   °· A very painful skin rash caused by the chickenpox virus (shingles).   °· Gallbladder disease.   °HOME CARE INSTRUCTIONS  °Home care will depend on the cause of your pain. In general, °· Rest as directed by your caregiver. °· Drink enough fluids to keep your urine clear or pale yellow. °· Only take over-the-counter or prescription medicines as directed by your caregiver. Some medicines may help relieve the pain. °· Tell your caregiver about any changes in your pain. °· Follow up with your caregiver as directed. °SEEK IMMEDIATE MEDICAL CARE IF:  °· Your pain is not controlled with medicine.   °· You have new or worsening symptoms. °· Your pain increases.   °· You have abdominal pain.   °· You have shortness of breath.   °· You have persistent nausea or vomiting.   °· You have swelling in your abdomen.   °· You feel faint or pass out.   °· You have blood in your urine. °· You have a fever or persistent symptoms for more than 2-3 days. °· You have a fever and your symptoms suddenly get worse. °MAKE SURE YOU:  °· Understand these instructions. °· Will watch your condition. °· Will get help right away if you are not doing well or get worse. °Document Released: 10/08/2005 Document Revised: 05/11/2012 Document Reviewed: 03/31/2012 °[employer]® Patient Information ©2015 [employer]. This information is not intended to replace advice given to you by your  health care provider. Make sure you discuss any questions you have with your health care provider. ° °

## 2015-03-30 NOTE — ED Provider Notes (Signed)
CSN: [id number]     Arrival date & time 03/30/15  1556 History   First MD Initiated Contact with Patient 03/30/15 1607     Chief Complaint  Patient presents with  . Flank Pain  . Abdominal Pain  . Testicle Pain     (Consider location/radiation/quality/duration/timing/severity/associated sxs/prior Treatment) HPI Comments: Patient presents to the emergency department with chief complaint of right-sided flank pain. Patient states the pain started last weekend. He reports that he has been having dark urine. Reports an extensive history of kidney stones. Reports that his last kidney stone required ureteral stenting. He has not taken anything to alleviate his symptoms. He has been unable to follow-up with urology. He denies any fevers, chills, nausea, or dysuria. He has had one episode of vomiting. Patient states the pain radiates to his testicle. He denies any pain in his testicle or pain with palpation or penile discharge.  The history is provided by the patient. No language interpreter was used.    Past Medical History  Diagnosis Date  . Kidney stones    Past Surgical History  Procedure Laterality Date  . Tonsillectomy    . Adenoidectomy    . Tubes in ears     Family History  Problem Relation Age of Onset  . Heart failure Father   . Heart failure Other    History  Substance Use Topics  . Smoking status: Current Every Day Smoker -- 1.00 packs/day    Types: Cigarettes  . Smokeless tobacco: Never Used  . Alcohol Use: Yes     Comment: occasional    Review of Systems  Constitutional: Negative for fever and chills.  Respiratory: Negative for shortness of breath.   Cardiovascular: Negative for chest pain.  Gastrointestinal: Negative for nausea, vomiting, diarrhea and constipation.  Genitourinary: Positive for flank pain. Negative for dysuria.      Allergies  Review of patient's allergies indicates no known allergies.  Home Medications   Prior to Admission medications    Medication Sig Start Date End Date Taking? Authorizing Provider  ibuprofen (ADVIL,MOTRIN) 800 MG tablet Take 800 mg by mouth every 8 (eight) hours as needed for moderate pain.   Yes Historical Provider, MD  amoxicillin (AMOXIL) 500 MG capsule Take 1 capsule (500 mg total) by mouth 3 (three) times daily. Patient not taking: Reported on 03/30/2015 07/07/14   Paula Libra, MD  HYDROcodone-acetaminophen (NORCO) 5-325 MG per tablet Take 1-2 tablets by mouth every 4 (four) hours as needed. Patient not taking: Reported on 03/30/2015 02/01/15   Blane Ohara, MD  HYDROcodone-acetaminophen (NORCO/VICODIN) 5-325 MG per tablet Take 1 tablet by mouth every 6 (six) hours as needed. Patient not taking: Reported on 03/30/2015 11/24/14   Bethann Berkshire, MD  oxyCODONE-acetaminophen (PERCOCET/ROXICET) 5-325 MG per tablet Take 1 tablet by mouth every 8 (eight) hours as needed for severe pain. Patient not taking: Reported on 03/30/2015 07/09/14   Zadie Rhine, MD  promethazine (PHENERGAN) 25 MG tablet Take 1 tablet (25 mg total) by mouth every 6 (six) hours as needed for nausea or vomiting. Patient not taking: Reported on 03/30/2015 11/24/14   Bethann Berkshire, MD   BP 130/91 mmHg  Pulse 102  Temp(Src) 97.9 F (36.6 C) (Oral)  Resp 18  SpO2 98% Physical Exam  Constitutional: He is oriented to person, place, and time. He appears well-developed and well-nourished.  HENT:  Head: Normocephalic and atraumatic.  Eyes: Conjunctivae and EOM are normal. Pupils are equal, round, and reactive to light. Right eye exhibits no  discharge. Left eye exhibits no discharge. No scleral icterus.  Neck: Normal range of motion. Neck supple. No JVD present.  Cardiovascular: Normal rate, regular rhythm and normal heart sounds.  Exam reveals no gallop and no friction rub.   No murmur heard. Pulmonary/Chest: Effort normal and breath sounds normal. No respiratory distress. He has no wheezes. He has no rales. He exhibits no tenderness.  Abdominal:  Soft. He exhibits no distension and no mass. There is no tenderness. There is no rebound and no guarding.  No focal abdominal tenderness, no RLQ tenderness or pain at McBurney's point, no RUQ tenderness or Murphy's sign, no left-sided abdominal tenderness, no fluid wave, or signs of peritonitis   Genitourinary:  No testicle pain with palpation, no erythema, no penile discharge  Musculoskeletal: Normal range of motion. He exhibits no edema or tenderness.  Neurological: He is alert and oriented to person, place, and time.  Skin: Skin is warm and dry.  Psychiatric: He has a normal mood and affect. His behavior is normal. Judgment and thought content normal.  Nursing note and vitals reviewed.   ED Course  Procedures (including critical care time) Labs Review Labs Reviewed  URINALYSIS, ROUTINE W REFLEX MICROSCOPIC (NOT AT [facility])  CBC WITH DIFFERENTIAL/PLATELET  I-STAT CHEM 8, ED    Imaging Review Ct Abdomen Pelvis Wo Contrast  03/30/2015   CLINICAL DATA:  Right-sided flank pain.  EXAM: CT ABDOMEN AND PELVIS WITHOUT CONTRAST  TECHNIQUE: Multidetector CT imaging of the abdomen and pelvis was performed following the standard protocol without IV contrast.  COMPARISON:  12/19/2012  FINDINGS: Lung bases are clear.  No effusions.  Heart is normal size.  Punctate nonobstructing stone in the midpole of the right kidney. No left renal stones. No ureteral stones or hydronephrosis. Urinary bladder is unremarkable.  Liver, gallbladder, spleen, pancreas, adrenals have an unremarkable unenhanced appearance.  Stomach, large and small bowel unremarkable. Appendix is visualized and is normal. No free fluid, free air or adenopathy. Aorta is normal caliber. No acute bony abnormality.  IMPRESSION: Punctate nonobstructing right renal stone. No ureteral stones or hydronephrosis. No acute findings.   Electronically Signed   By: Charlett Nose M.D.   On: 03/30/2015 16:47     EKG Interpretation None      MDM   Final  diagnoses:  Right flank pain    Patient with reported recurrent kidney stones, the last one requiring ureteral stenting. Patient states that his pain is severe. Will check CT scan as he required intervention in the past. We'll treat pain, and will reassess.  Pain improved somewhat with Dilaudid, offered Toradol, patient requests morphine. Will order morphine and will reassess. CT scan is negative for acute obstructing stone. There is a punctate nonobstructing right renal stone. The appendix is visualized and is normal. No other abnormalities on CT. Doubt torsion, testicular exam is unremarkable.  6:33 PM, patient is requesting to be discharged. States that his pain is now a 0 out of 10. Will recommend urology follow-up. Patient states that he will be unable to follow-up because he does not have insurance or the finances to follow-up with urology. I gave him return precautions, which he understands. He is stable and ready for discharge.    Roxy Horseman, PA-C 03/30/15 4782  Gwyneth Sprout, MD 03/30/15 [phone]

## 2015-03-30 NOTE — ED Notes (Signed)
Pt states right sided flank pain, lower abdominal pain, and states "my testicle feels like it's going to fall to the floor with a terrible pulling-type pain" that all began at the start of this week. States he's been having tea colored urine, and has a hx of large kidney stones.

## 2015-05-13 ENCOUNTER — Encounter (HOSPITAL_COMMUNITY): Payer: Self-pay

## 2015-05-13 ENCOUNTER — Emergency Department (HOSPITAL_COMMUNITY)
Admission: EM | Admit: 2015-05-13 | Discharge: 2015-05-13 | Disposition: A | Discharge status: Home / Self Care | Payer: Self-pay | Attending: Emergency Medicine | Admitting: Emergency Medicine

## 2015-05-13 ENCOUNTER — Emergency Department (HOSPITAL_COMMUNITY): Payer: Self-pay

## 2015-05-13 DIAGNOSIS — R112 Nausea with vomiting, unspecified: Secondary | ICD-10-CM | POA: Insufficient documentation

## 2015-05-13 DIAGNOSIS — Z87442 Personal history of urinary calculi: Secondary | ICD-10-CM | POA: Insufficient documentation

## 2015-05-13 DIAGNOSIS — R109 Unspecified abdominal pain: Secondary | ICD-10-CM | POA: Insufficient documentation

## 2015-05-13 DIAGNOSIS — F1210 Cannabis abuse, uncomplicated: Secondary | ICD-10-CM | POA: Insufficient documentation

## 2015-05-13 DIAGNOSIS — F1110 Opioid abuse, uncomplicated: Secondary | ICD-10-CM | POA: Insufficient documentation

## 2015-05-13 DIAGNOSIS — F1410 Cocaine abuse, uncomplicated: Secondary | ICD-10-CM | POA: Insufficient documentation

## 2015-05-13 DIAGNOSIS — Z720 Tobacco use: Secondary | ICD-10-CM | POA: Insufficient documentation

## 2015-05-13 LAB — RAPID URINE DRUG SCREEN, HOSP PERFORMED
AMPHETAMINES: NOT DETECTED
BENZODIAZEPINES: NOT DETECTED
Barbiturates: NOT DETECTED
COCAINE: POSITIVE — AB
OPIATES: POSITIVE — AB
Tetrahydrocannabinol: POSITIVE — AB

## 2015-05-13 LAB — BASIC METABOLIC PANEL
ANION GAP: 7 (ref 5–15)
BUN: 12 mg/dL (ref 6–20)
CALCIUM: 9.4 mg/dL (ref 8.9–10.3)
CO2: 24 mmol/L (ref 22–32)
Chloride: 108 mmol/L (ref 101–111)
Creatinine, Ser: 0.99 mg/dL (ref 0.61–1.24)
Glucose, Bld: 82 mg/dL (ref 65–99)
POTASSIUM: 4.2 mmol/L (ref 3.5–5.1)
SODIUM: 139 mmol/L (ref 135–145)

## 2015-05-13 LAB — CBC WITH DIFFERENTIAL/PLATELET
BASOS ABS: 0.1 10*3/uL (ref 0.0–0.1)
BASOS PCT: 1 % (ref 0–1)
EOS PCT: 4 % (ref 0–5)
Eosinophils Absolute: 0.3 10*3/uL (ref 0.0–0.7)
HCT: 46.5 % (ref 39.0–52.0)
Hemoglobin: 16.2 g/dL (ref 13.0–17.0)
LYMPHS PCT: 35 % (ref 12–46)
Lymphs Abs: 2.6 10*3/uL (ref 0.7–4.0)
MCH: 31.7 pg (ref 26.0–34.0)
MCHC: 34.8 g/dL (ref 30.0–36.0)
MCV: 91 fL (ref 78.0–100.0)
MONO ABS: 0.9 10*3/uL (ref 0.1–1.0)
Monocytes Relative: 12 % (ref 3–12)
Neutro Abs: 3.5 10*3/uL (ref 1.7–7.7)
Neutrophils Relative %: 48 % (ref 43–77)
PLATELETS: 333 10*3/uL (ref 150–400)
RBC: 5.11 MIL/uL (ref 4.22–5.81)
RDW: 12.8 % (ref 11.5–15.5)
WBC: 7.3 10*3/uL (ref 4.0–10.5)

## 2015-05-13 LAB — URINALYSIS, ROUTINE W REFLEX MICROSCOPIC
Bilirubin Urine: NEGATIVE
Glucose, UA: NEGATIVE mg/dL
HGB URINE DIPSTICK: NEGATIVE
Ketones, ur: NEGATIVE mg/dL
LEUKOCYTES UA: NEGATIVE
Nitrite: NEGATIVE
PROTEIN: NEGATIVE mg/dL
Specific Gravity, Urine: 1.01 (ref 1.005–1.030)
UROBILINOGEN UA: 0.2 mg/dL (ref 0.0–1.0)
pH: 7 (ref 5.0–8.0)

## 2015-05-13 MED ORDER — KETOROLAC TROMETHAMINE 30 MG/ML IJ SOLN
30.0000 mg | Freq: Once | INTRAMUSCULAR | Status: AC
  Administered 2015-05-13: 30 mg via INTRAVENOUS
  Filled 2015-05-13: qty 1

## 2015-05-13 MED ORDER — ONDANSETRON 8 MG PO TBDP: 8.0000 mg | ORAL_TABLET | Freq: Three times a day (TID) | ORAL | Status: AC | PRN

## 2015-05-13 MED ORDER — ONDANSETRON HCL 4 MG/2ML IJ SOLN
4.0000 mg | Freq: Four times a day (QID) | INTRAMUSCULAR | Status: DC | PRN
  Administered 2015-05-13: 4 mg via INTRAVENOUS
  Filled 2015-05-13: qty 2

## 2015-05-13 MED ORDER — IBUPROFEN 600 MG PO TABS: 600.0000 mg | ORAL_TABLET | Freq: Three times a day (TID) | ORAL | Status: AC | PRN

## 2015-05-13 NOTE — ED Notes (Signed)
Pt walked out, removed  His own IV after seeing MD.

## 2015-05-13 NOTE — ED Provider Notes (Signed)
CSN: [id number]     Arrival date & time 05/13/15  0830 History   This chart was scribed for Jason Bilis, MD by Marica Otter, ED Scribe. This patient was seen in room APA04/[facility]04 and the patient's care was started at 8:42 AM.   Chief Complaint  Patient presents with  . Flank Pain   The history is provided by the patient. No language interpreter was used.   UDS September 11,2016 +COCAINE +Opioids   PCP: No PCP Per Patient HPI Comments: Jason Khan is a 24 y.o. male, with PMHx noted below, who presents to the Emergency Department complaining of sudden onset, severe, acute, 10/10, aching, constant left flank pain with associated nausea and vomiting onset this morning while pt was at work. No diarrhea. Reports hx of kidney stones. No dysuria or urinary frequency. No injury or trauma. No heavy lifting. Pain radiates towards left testicle from his left flank. No hematesis.   Past Medical History  Diagnosis Date  . Kidney stones    Past Surgical History  Procedure Laterality Date  . Tonsillectomy    . Adenoidectomy    . Tubes in ears     Family History  Problem Relation Age of Onset  . Heart failure Father   . Heart failure Other    Social History  Substance Use Topics  . Smoking status: Current Every Day Smoker -- 1.00 packs/day    Types: Cigarettes  . Smokeless tobacco: Never Used  . Alcohol Use: Yes     Comment: occasional    Review of Systems  Gastrointestinal: Positive for nausea and vomiting.  Genitourinary: Positive for flank pain.  All other systems reviewed and are negative.  Allergies  Review of patient's allergies indicates no known allergies.  Home Medications   Prior to Admission medications   Medication Sig Start Date End Date Taking? Authorizing Provider  amoxicillin (AMOXIL) 500 MG capsule Take 1 capsule (500 mg total) by mouth 3 (three) times daily. Patient not taking: Reported on 03/30/2015 07/07/14   Paula Libra, MD  HYDROcodone-acetaminophen  (NORCO) 5-325 MG per tablet Take 1-2 tablets by mouth every 4 (four) hours as needed. Patient not taking: Reported on 03/30/2015 02/01/15   Blane Ohara, MD  HYDROcodone-acetaminophen (NORCO/VICODIN) 5-325 MG per tablet Take 1 tablet by mouth every 6 (six) hours as needed. Patient not taking: Reported on 03/30/2015 11/24/14   Bethann Berkshire, MD  ibuprofen (ADVIL,MOTRIN) 800 MG tablet Take 800 mg by mouth every 8 (eight) hours as needed for moderate pain.    Historical Provider, MD  oxyCODONE-acetaminophen (PERCOCET/ROXICET) 5-325 MG per tablet Take 1 tablet by mouth every 8 (eight) hours as needed for severe pain. Patient not taking: Reported on 03/30/2015 07/09/14   Zadie Rhine, MD  promethazine (PHENERGAN) 25 MG tablet Take 1 tablet (25 mg total) by mouth every 6 (six) hours as needed for nausea or vomiting. Patient not taking: Reported on 03/30/2015 11/24/14   Bethann Berkshire, MD   Triage Vitals: BP 106/87 mmHg  Pulse 94  Temp(Src)   Resp 22  Ht 6' (1.829 m)  Wt 175 lb (79.379 kg)  BMI 23.73 kg/m2  SpO2 100% Physical Exam  Constitutional: He is oriented to person, place, and time. He appears well-developed and well-nourished.  HENT:  Head: Normocephalic and atraumatic.  Eyes: EOM are normal.  Neck: Normal range of motion.  Cardiovascular: Normal rate, regular rhythm, normal heart sounds and intact distal pulses.   Pulmonary/Chest: Effort normal and breath sounds normal. No respiratory distress.  Abdominal: Soft. He exhibits no distension. There is no tenderness.  Musculoskeletal: Normal range of motion.  Neurological: He is alert and oriented to person, place, and time.  Skin: Skin is warm and dry.  Psychiatric: He has a normal mood and affect. Judgment normal.  Nursing note and vitals reviewed.   ED Course  Procedures (including critical care time) DIAGNOSTIC STUDIES: Oxygen Saturation is 100% on RA, nl by my interpretation.    COORDINATION OF CARE: 8:44 AM: Discussed treatment  plan which includes meds, imaging, and labs with pt at bedside; patient verbalizes understanding and agrees with treatment plan. 9:44 AM: recheck, pt resting. Discussed discharge instructions with pt.    Labs Review Labs Reviewed  URINE RAPID DRUG SCREEN, HOSP PERFORMED - Abnormal; Notable for the following:    Opiates POSITIVE (*)    Cocaine POSITIVE (*)    Tetrahydrocannabinol POSITIVE (*)    All other components within normal limits  URINALYSIS, ROUTINE W REFLEX MICROSCOPIC (NOT AT [facility])  CBC WITH DIFFERENTIAL/PLATELET  BASIC METABOLIC PANEL    Imaging Review US Renal  05/13/2015   CLINICAL DATA:  Left flank/testicle pain.  EXAM: RENAL / URINARY TRACT ULTRASOUND COMPLETE  COMPARISON:  None.  FINDINGS: Right Kidney:  Length: 10.3 cm. Echogenicity and thickness of the right renal cortex is normal. No right renal mass, cyst, stone, or hydronephrosis.  Left Kidney:  Length: 10 cm. Echogenicity and thickness of the left renal cortex is normal. No left renal mass, cyst, stone, or hydronephrosis.  Bladder:  Appears normal for degree of bladder distention. Both distal ureters are shown to be patent at the level of the bladder (bilateral ureteral jets visualized). No bladder stones seen.  IMPRESSION: Normal renal ultrasound.   Electronically Signed   By: Bary Richard M.D.   On: 05/13/2015 09:28   I have personally reviewed and evaluated these images and lab results as part of my medical decision-making.    MDM   Final diagnoses:  Flank pain  Cocaine abuse  Opioid abuse    Left flank pain without UTI, or hematuria, or hydro. Doubt diverticulitis. Dc home in good condition. Concerning for substance abuse given +opioids and +cocaine on UDS. No opioids given in ER. Ketorolac only. Pt walked out without his prescriptions and without vitals by nursing.   I personally performed the services described in this documentation, which was scribed in my presence. The recorded information has been  reviewed and is accurate.      Jason Bilis, MD 05/14/15 [phone]

## 2015-05-13 NOTE — ED Notes (Signed)
Pt c/o pain in left flank that started when he got to work this morning.  States pain radiates around to groin.  Reports n/v.

## 2015-05-13 NOTE — Discharge Instructions (Signed)
Chemical Dependency  Chemical dependency is an addiction to drugs or alcohol. It is characterized by the repeated behavior of seeking out and using drugs and alcohol despite harmful consequences to the health and safety of ones self and others.   RISK FACTORS  There are certain situations or behaviors that increase a person's risk for chemical dependency. These include:  · A family history of chemical dependency.  · A history of mental health issues, including depression and anxiety.  · A home environment where drugs and alcohol are easily available to you.  · Drug or alcohol use at a young age.  SYMPTOMS   The following symptoms can indicate chemical dependency:  · Inability to limit the use of drugs or alcohol.  · Nausea, sweating, shakiness, and anxiety that occurs when alcohol or drugs are not being used.  · An increase in amount of drugs or alcohol that is necessary to get drunk or high.  People who experience these symptoms can assess their use of drugs and alcohol by asking themselves the following questions:  · Have you been told by friends or family that they are worried about your use of alcohol or drugs?  · Do friends and family ever tell you about things you did while drinking alcohol or using drugs that you do not remember?  · Do you lie about using alcohol or drugs or about the amounts you use?  · Do you have difficulty completing daily tasks unless you use alcohol or drugs?  · Is the level of your work or school performance lower because of your drug or alcohol use?  · Do you get sick from using drugs or alcohol but keep using anyway?  · Do you feel uncomfortable in social situations unless you use alcohol or drugs?  · Do you use drugs or alcohol to help forget problems?   An answer of yes to any of these questions may indicate chemical dependency. Professional evaluation is suggested.  Document Released: 08/11/2001 Document Revised: 11/09/2011 Document Reviewed: 10/23/2010  [employer]® Patient  Information ©2015 [employer]. This information is not intended to replace advice given to you by your health care provider. Make sure you discuss any questions you have with your health care provider.

## 2015-05-13 NOTE — ED Notes (Signed)
Did not receive discharge instructions.

## 2015-10-29 IMAGING — CT CT ABD-PELV W/O CM
2 of 4 series · 17 of 46 positions shown, 19 images · non-contrast
Comparison: 12/19/2012

CLINICAL DATA: Right-sided flank pain.

EXAM:
CT ABDOMEN AND PELVIS WITHOUT CONTRAST
TECHNIQUE: Multidetector CT imaging of the abdomen and pelvis was performed
following the standard protocol without IV contrast.

[Series 2: abd/pel w/o · axial · non-contrast · 0.85mm/px · z∈[+948,+1378]mm · 14 of 96 slices shown, 16 images]
[im 5/96  soft-tissue]
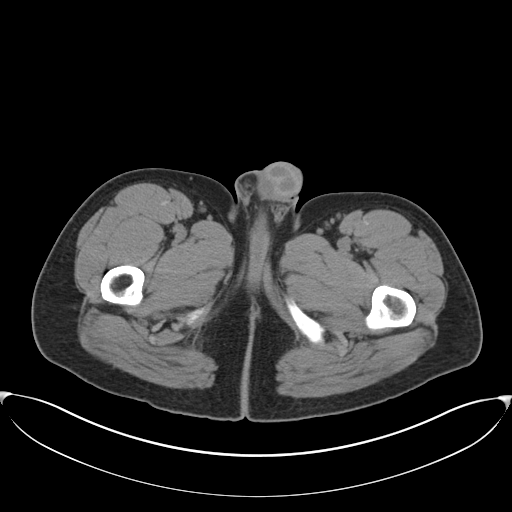
[im 5/96  bone]
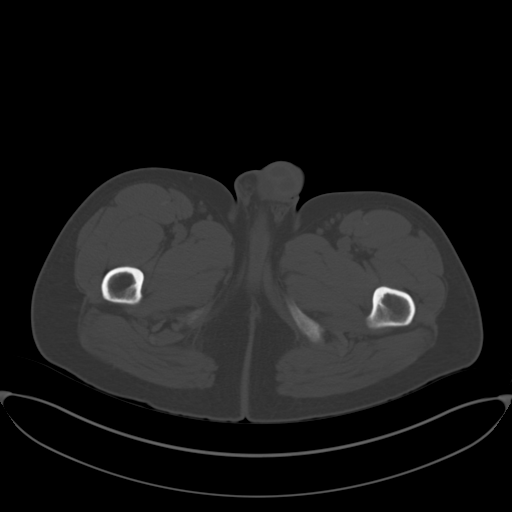
[im 13/96  soft-tissue]
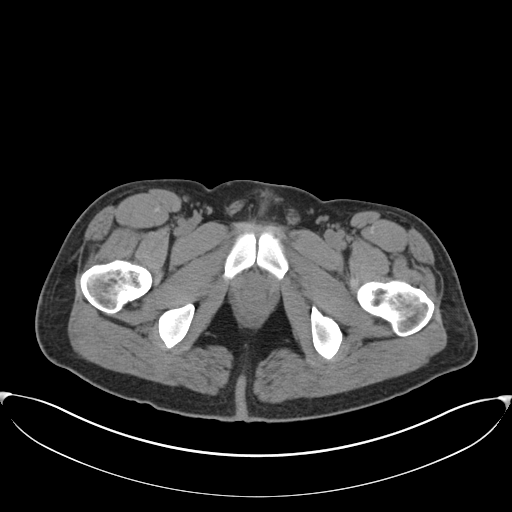
[im 17/96  soft-tissue]
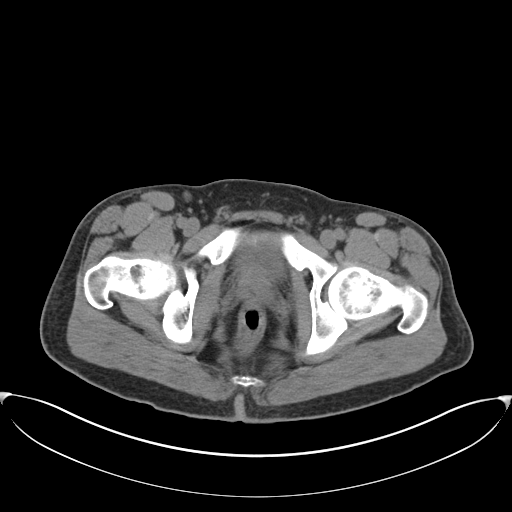
[im 25/96  soft-tissue]
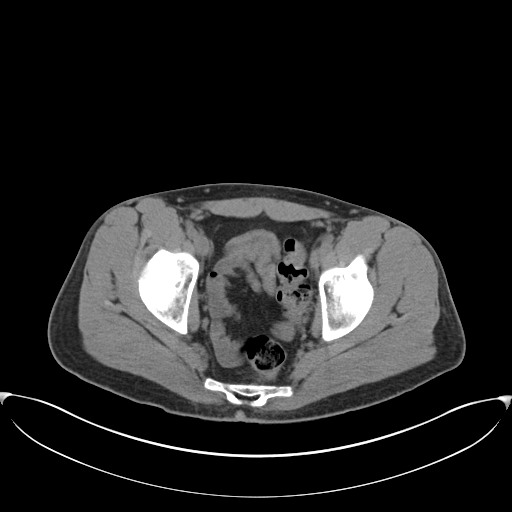
[im 34/96  soft-tissue]
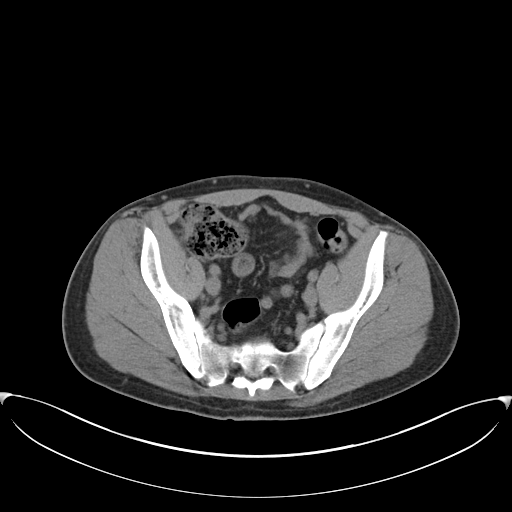
[im 38/96  soft-tissue]
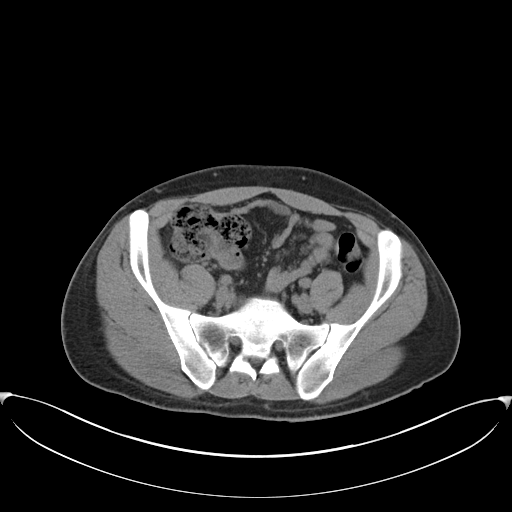
[im 46/96  soft-tissue]
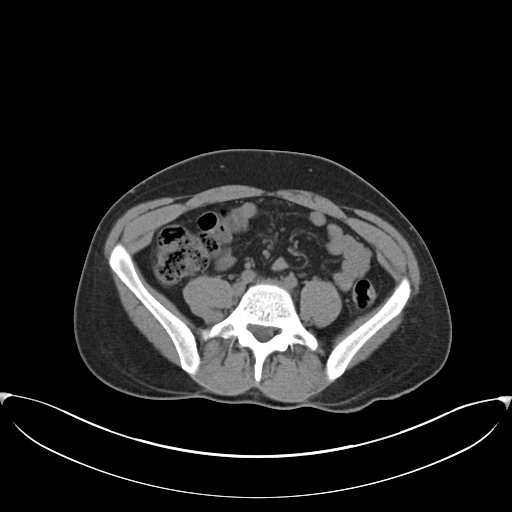
[im 50/96  soft-tissue]
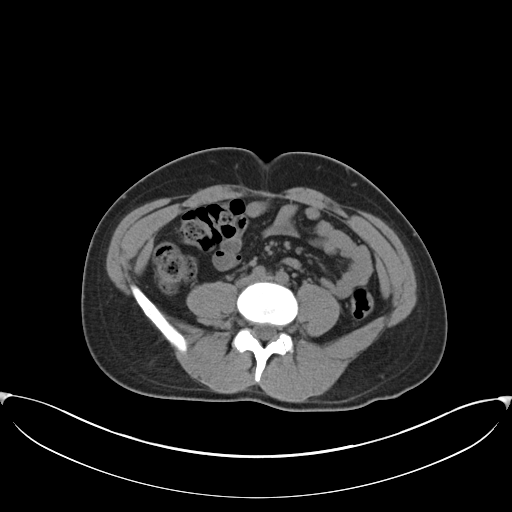
[im 58/96  soft-tissue]
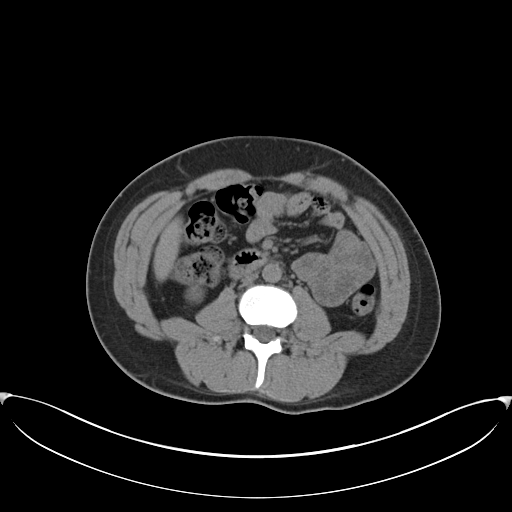
[im 58/96  bone]
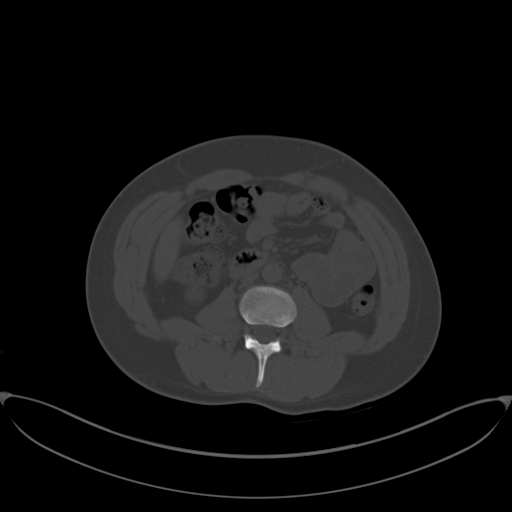
[im 62/96  soft-tissue]
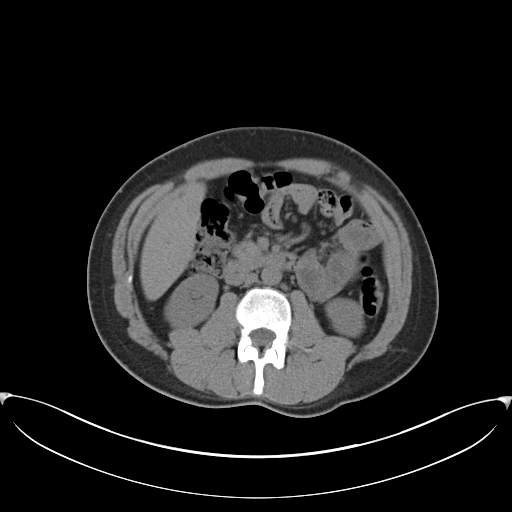
[im 71/96  soft-tissue]
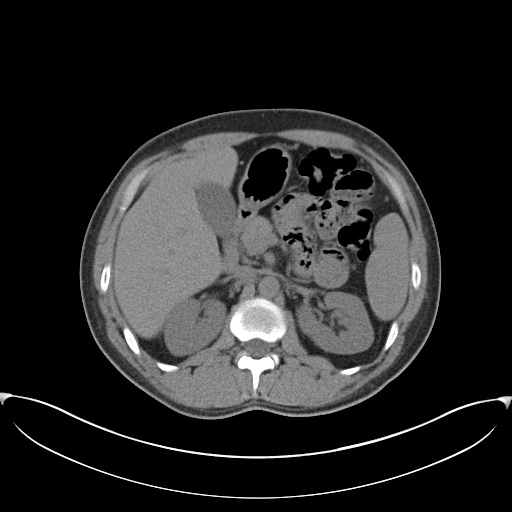
[im 79/96  soft-tissue]
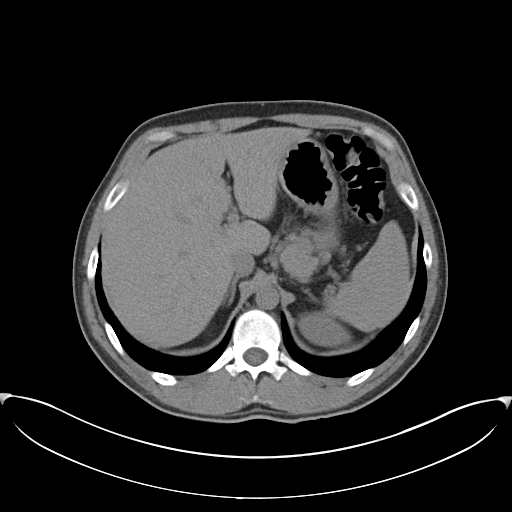
[im 83/96  soft-tissue]
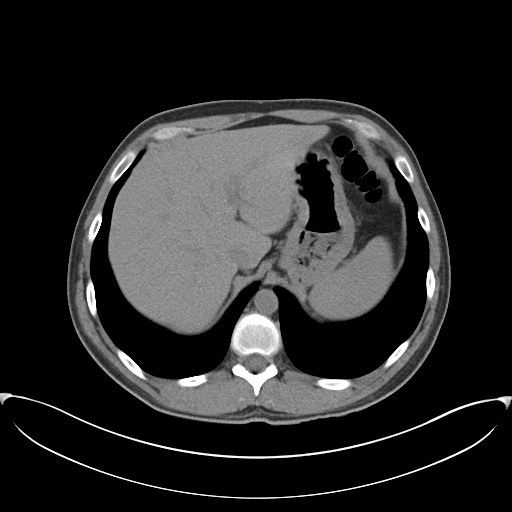
[im 91/96  soft-tissue]
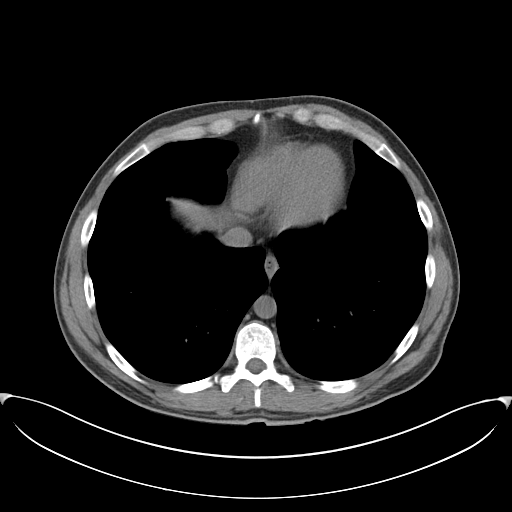

[Series 3: coronal · coronal · 0.74mm/px · 3 of 91 slices shown]
[im 31/91  soft-tissue]
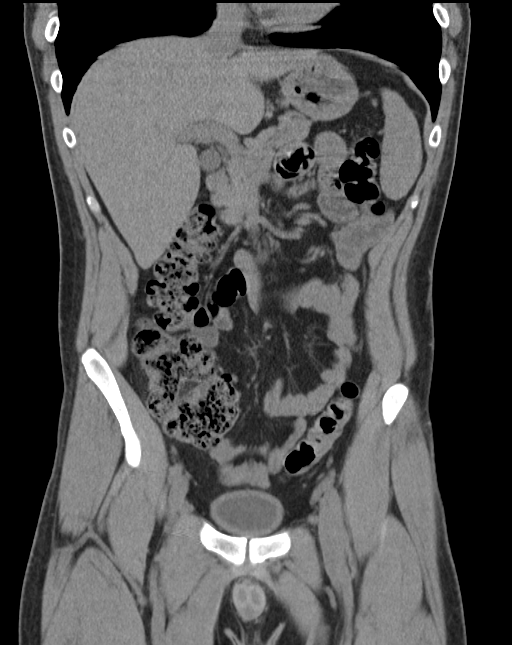
[im 41/91  soft-tissue]
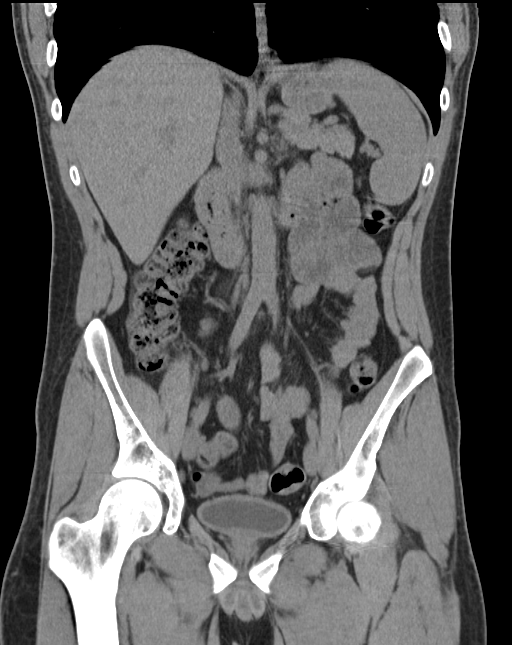
[im 51/91  soft-tissue]
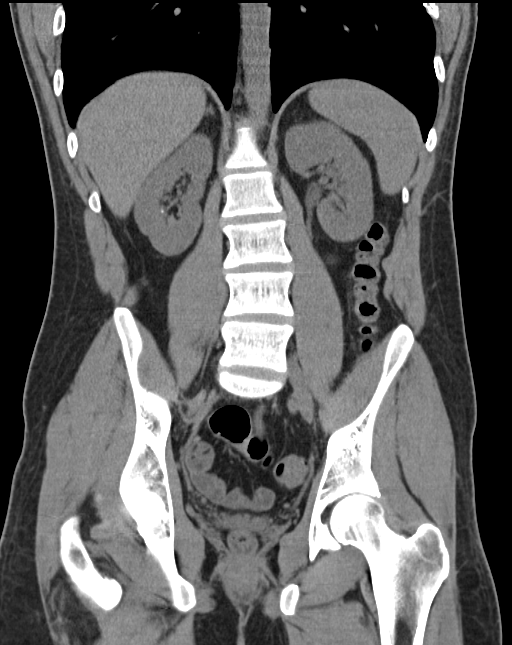

[17 of 46 positions shown; findings below may reference images not displayed]

FINDINGS: Lung bases are clear.  No effusions.  Heart is normal size.

Punctate nonobstructing stone in the midpole of the right kidney. No
left renal stones. No ureteral stones or hydronephrosis. Urinary
bladder is unremarkable.

Liver, gallbladder, spleen, pancreas, adrenals have an unremarkable
unenhanced appearance.

Stomach, large and small bowel unremarkable. Appendix is visualized
and is normal. No free fluid, free air or adenopathy. Aorta is
normal caliber. No acute bony abnormality.
IMPRESSION: Punctate nonobstructing right renal stone. No ureteral stones or
hydronephrosis. No acute findings.

## 2016-11-13 ENCOUNTER — Emergency Department (HOSPITAL_COMMUNITY)
Admission: EM | Admit: 2016-11-13 | Discharge: 2016-11-13 | Disposition: A | Discharge status: Home / Self Care | Payer: Self-pay | Attending: Dermatology | Admitting: Dermatology

## 2016-11-13 ENCOUNTER — Encounter (HOSPITAL_COMMUNITY): Payer: Self-pay | Admitting: Emergency Medicine

## 2016-11-13 DIAGNOSIS — S51812A Laceration without foreign body of left forearm, initial encounter: Secondary | ICD-10-CM | POA: Insufficient documentation

## 2016-11-13 DIAGNOSIS — Z79899 Other long term (current) drug therapy: Secondary | ICD-10-CM | POA: Insufficient documentation

## 2016-11-13 DIAGNOSIS — Y990 Civilian activity done for income or pay: Secondary | ICD-10-CM | POA: Insufficient documentation

## 2016-11-13 DIAGNOSIS — W268XXA Contact with other sharp object(s), not elsewhere classified, initial encounter: Secondary | ICD-10-CM | POA: Insufficient documentation

## 2016-11-13 DIAGNOSIS — Y929 Unspecified place or not applicable: Secondary | ICD-10-CM | POA: Insufficient documentation

## 2016-11-13 DIAGNOSIS — Z5321 Procedure and treatment not carried out due to patient leaving prior to being seen by health care provider: Secondary | ICD-10-CM | POA: Insufficient documentation

## 2016-11-13 DIAGNOSIS — F17210 Nicotine dependence, cigarettes, uncomplicated: Secondary | ICD-10-CM | POA: Insufficient documentation

## 2016-11-13 DIAGNOSIS — Y9389 Activity, other specified: Secondary | ICD-10-CM | POA: Insufficient documentation

## 2016-11-13 NOTE — ED Triage Notes (Signed)
Patient was working on aluminum siding and cut inside left forearm.

## 2016-11-13 NOTE — ED Notes (Signed)
Patient was inpatient and did not want to wait for physician, small laceration to anterior wrist, washed patients wrist and applied gauze.
